# Patient Record
Sex: Male | Born: 1960 | Race: White | Hispanic: No | Smoking: Never smoker
Health system: Southern US, Community
[De-identification: ages and names within clinical notes are randomized; demographics above are authoritative.]

## PROBLEM LIST (undated history)

## (undated) DIAGNOSIS — E079 Disorder of thyroid, unspecified: Secondary | ICD-10-CM

## (undated) DIAGNOSIS — Z87442 Personal history of urinary calculi: Secondary | ICD-10-CM

## (undated) DIAGNOSIS — M1990 Unspecified osteoarthritis, unspecified site: Secondary | ICD-10-CM

## (undated) DIAGNOSIS — N200 Calculus of kidney: Secondary | ICD-10-CM

## (undated) DIAGNOSIS — I10 Essential (primary) hypertension: Secondary | ICD-10-CM

## (undated) HISTORY — DX: Unspecified osteoarthritis, unspecified site: M19.90

---

## 2010-09-29 ENCOUNTER — Emergency Department (HOSPITAL_COMMUNITY)
Admission: EM | Admit: 2010-09-29 | Discharge: 2010-09-29 | Payer: Self-pay | Source: Home / Self Care | Admitting: Emergency Medicine

## 2010-12-21 LAB — CBC
HCT: 46.3 % (ref 39.0–52.0)
Hemoglobin: 16.4 g/dL (ref 13.0–17.0)
MCHC: 35.4 g/dL (ref 30.0–36.0)
MCV: 84.6 fL (ref 78.0–100.0)
Platelets: 184 10*3/uL (ref 150–400)
RDW: 12.6 % (ref 11.5–15.5)

## 2010-12-21 LAB — BASIC METABOLIC PANEL
CO2: 27 mEq/L (ref 19–32)
Chloride: 105 mEq/L (ref 96–112)
Creatinine, Ser: 1.35 mg/dL (ref 0.4–1.5)
GFR calc Af Amer: >60 mL/min (ref >60)
Glucose, Bld: 102 mg/dL — ABNORMAL HIGH (ref 70–99)

## 2010-12-21 LAB — DIFFERENTIAL
Basophils Relative: 0 % (ref 0–1)
Eosinophils Absolute: 0 10*3/uL (ref 0.0–0.7)
Neutro Abs: 5.9 10*3/uL (ref 1.7–7.7)
Neutrophils Relative %: 86 % — ABNORMAL HIGH (ref 43–77)

## 2010-12-21 LAB — URINALYSIS, ROUTINE W REFLEX MICROSCOPIC
Leukocytes, UA: NEGATIVE
Nitrite: NEGATIVE
Specific Gravity, Urine: 1.013 (ref 1.005–1.030)
Urobilinogen, UA: 0.2 mg/dL (ref 0.0–1.0)
pH: 6 (ref 5.0–8.0)

## 2010-12-21 LAB — URINE MICROSCOPIC-ADD ON

## 2012-07-17 ENCOUNTER — Encounter (INDEPENDENT_AMBULATORY_CARE_PROVIDER_SITE_OTHER): Payer: Self-pay | Admitting: Surgery

## 2012-07-20 ENCOUNTER — Other Ambulatory Visit (INDEPENDENT_AMBULATORY_CARE_PROVIDER_SITE_OTHER): Payer: Self-pay | Admitting: Surgery

## 2012-07-20 ENCOUNTER — Encounter (INDEPENDENT_AMBULATORY_CARE_PROVIDER_SITE_OTHER): Payer: Self-pay | Admitting: Surgery

## 2012-07-20 ENCOUNTER — Ambulatory Visit
Admission: RE | Admit: 2012-07-20 | Discharge: 2012-07-20 | Disposition: A | Discharge status: Home / Self Care | Payer: 59 | Source: Ambulatory Visit | Attending: Surgery | Admitting: Surgery

## 2012-07-20 ENCOUNTER — Ambulatory Visit (INDEPENDENT_AMBULATORY_CARE_PROVIDER_SITE_OTHER): Payer: 59 | Admitting: Surgery

## 2012-07-20 VITALS — BP 126/80 | HR 73 | Temp 98.0°F | Resp 18 | Ht 66.0 in | Wt 126.4 lb

## 2012-07-20 DIAGNOSIS — R229 Localized swelling, mass and lump, unspecified: Secondary | ICD-10-CM

## 2012-07-20 DIAGNOSIS — R634 Abnormal weight loss: Secondary | ICD-10-CM

## 2012-07-20 DIAGNOSIS — R509 Fever, unspecified: Secondary | ICD-10-CM

## 2012-07-20 DIAGNOSIS — R2230 Localized swelling, mass and lump, unspecified upper limb: Secondary | ICD-10-CM

## 2012-07-20 NOTE — Progress Notes (Signed)
Patient ID: Alexander Cervantes, male   DOB: 11-13-1960, 51 y.o.   MRN: [id number]  Chief Complaint  Patient presents with  . Other    left arm nodule    HPI Alexander Cervantes is a 51 y.o. male.   HPI This is a pleasant gentleman referred by Dr. Foy Guadalajara and Gillermina Hu PA for a small mass on his left arm just above the elbow. It had been present for almost one year, but is now getting larger. Last week he is having increasing discomfort as well some very slight numbness in the left hand. He reports he has some pain it goes into the left axilla as well. He also reports occasional fevers and chills. His appetite is poor and he is having weight loss. He denies abdominal pain, nausea, or vomiting. He also denies trauma to the extremity. History reviewed. No pertinent past medical history.  History reviewed. No pertinent past surgical history.  History reviewed. No pertinent family history.  Social History History  Substance Use Topics  . Smoking status: Never Smoker   . Smokeless tobacco: Not on file  . Alcohol Use: No    No Known Allergies  Current Outpatient Prescriptions  Medication Sig Dispense Refill  . naproxen sodium (ANAPROX) 220 MG tablet Take 220 mg by mouth 2 (two) times daily with a meal.        Review of Systems Review of Systems  Constitutional: Positive for fever, appetite change and fatigue.  HENT: Negative.   Eyes: Negative.   Respiratory: Positive for cough.   Cardiovascular: Negative.   Gastrointestinal: Negative.   Genitourinary: Negative.   Musculoskeletal: Positive for myalgias and joint swelling.  Neurological: Positive for numbness.  Hematological: Negative.   Psychiatric/Behavioral: Negative.     Blood pressure 126/80, pulse 73, temperature 98 F (36.7 C), temperature source Oral, resp. rate 18, height 5\' 6"  (1.676 m), weight 126 lb 6.4 oz (57.335 kg).  Physical Exam Physical Exam  Constitutional: He is oriented to person, place, and time. He appears  well-developed and well-nourished. No distress.  HENT:  Head: Normocephalic and atraumatic.  Right Ear: External ear normal.  Left Ear: External ear normal.  Nose: Nose normal.  Mouth/Throat: Oropharynx is clear and moist. No oropharyngeal exudate.  Eyes: Conjunctivae normal are normal. Pupils are equal, round, and reactive to light. Right eye exhibits no discharge. Left eye exhibits no discharge. No scleral icterus.  Neck: Normal range of motion. Neck supple. No tracheal deviation present.  Cardiovascular: Normal rate, regular rhythm, normal heart sounds and intact distal pulses.   No murmur heard. Pulmonary/Chest: Effort normal and breath sounds normal. No respiratory distress. He has no wheezes.  Abdominal: Soft. Bowel sounds are normal. He exhibits no distension. There is no tenderness. There is no rebound.  Musculoskeletal: Normal range of motion. He exhibits no edema.       There is a firm mass which is approximately 3-4  Centimeters in size on the medial aspect of his left arm just above the elbow.  It almost feels involving the tendon. He does have full range of motion at the elbow and strength in the left hand and wrist appears normal. The mass is nonpulsatile  Lymphadenopathy:    He has no cervical adenopathy.    He has no axillary adenopathy.       Right: No inguinal adenopathy present.       Left: No inguinal adenopathy present.  Neurological: He is alert and oriented to person, place, and time.  Skin: Skin is warm and dry. He is not diaphoretic. No erythema. No pallor.  Psychiatric: His behavior is normal. Judgment normal.    Data Reviewed   Assessment    Left arm mass of uncertain etiology    Plan    Because the location of this mass, I believe he needs evaluation by orthopedic surgeon. I have discussed this with Dr. Allie Bossier. He will be seeing the patient in consultation. At his request, I have ordered an MRI with contrast of the extremity to evaluate the  masses proximity to the tendons as well as neurovascular supply. I am also going to check a chest x-ray as well as a CBC with differential and BMP given his symptoms. I will arrange the appointment with [facility] orthopedics.       Lakrisha Iseman A 07/20/2012, 3:41 PM

## 2012-07-25 ENCOUNTER — Inpatient Hospital Stay: Admission: RE | Admit: 2012-07-25 | Payer: Self-pay | Source: Ambulatory Visit

## 2012-07-26 ENCOUNTER — Ambulatory Visit
Admission: RE | Admit: 2012-07-26 | Discharge: 2012-07-26 | Disposition: A | Discharge status: Home / Self Care | Payer: 59 | Source: Ambulatory Visit | Attending: Surgery | Admitting: Surgery

## 2012-07-26 DIAGNOSIS — R2230 Localized swelling, mass and lump, unspecified upper limb: Secondary | ICD-10-CM

## 2012-07-29 ENCOUNTER — Other Ambulatory Visit: Payer: 59

## 2012-07-31 ENCOUNTER — Encounter (HOSPITAL_COMMUNITY): Payer: Self-pay | Admitting: *Deleted

## 2012-07-31 ENCOUNTER — Encounter (HOSPITAL_COMMUNITY): Payer: Self-pay | Admitting: Pharmacy Technician

## 2012-07-31 ENCOUNTER — Other Ambulatory Visit (HOSPITAL_COMMUNITY): Payer: Self-pay | Admitting: Orthopaedic Surgery

## 2012-07-31 NOTE — Progress Notes (Signed)
Spoke with Sherrie from Dr. Eliberto Ivory office requesting orders be signed.

## 2012-07-31 NOTE — Progress Notes (Signed)
Need orders in [facility] for surgery 08/01/12

## 2012-08-01 ENCOUNTER — Ambulatory Visit (HOSPITAL_COMMUNITY): Payer: 59 | Admitting: Anesthesiology

## 2012-08-01 ENCOUNTER — Encounter (HOSPITAL_COMMUNITY): Payer: Self-pay | Admitting: *Deleted

## 2012-08-01 ENCOUNTER — Ambulatory Visit (HOSPITAL_COMMUNITY)
Admission: RE | Admit: 2012-08-01 | Discharge: 2012-08-01 | Disposition: A | Discharge status: Home / Self Care | Payer: 59 | Source: Ambulatory Visit | Attending: Orthopaedic Surgery | Admitting: Orthopaedic Surgery

## 2012-08-01 ENCOUNTER — Encounter (HOSPITAL_COMMUNITY)
Admission: RE | Disposition: A | Discharge status: Home / Self Care | Payer: Self-pay | Source: Ambulatory Visit | Attending: Orthopaedic Surgery

## 2012-08-01 ENCOUNTER — Encounter (HOSPITAL_COMMUNITY): Payer: Self-pay | Admitting: Anesthesiology

## 2012-08-01 DIAGNOSIS — R229 Localized swelling, mass and lump, unspecified: Secondary | ICD-10-CM | POA: Insufficient documentation

## 2012-08-01 DIAGNOSIS — E65 Localized adiposity: Secondary | ICD-10-CM | POA: Insufficient documentation

## 2012-08-01 DIAGNOSIS — I889 Nonspecific lymphadenitis, unspecified: Secondary | ICD-10-CM | POA: Insufficient documentation

## 2012-08-01 DIAGNOSIS — L089 Local infection of the skin and subcutaneous tissue, unspecified: Secondary | ICD-10-CM | POA: Insufficient documentation

## 2012-08-01 DIAGNOSIS — Z7982 Long term (current) use of aspirin: Secondary | ICD-10-CM | POA: Insufficient documentation

## 2012-08-01 DIAGNOSIS — L905 Scar conditions and fibrosis of skin: Secondary | ICD-10-CM | POA: Insufficient documentation

## 2012-08-01 DIAGNOSIS — M7989 Other specified soft tissue disorders: Secondary | ICD-10-CM | POA: Insufficient documentation

## 2012-08-01 DIAGNOSIS — M79609 Pain in unspecified limb: Secondary | ICD-10-CM | POA: Insufficient documentation

## 2012-08-01 DIAGNOSIS — R2230 Localized swelling, mass and lump, unspecified upper limb: Secondary | ICD-10-CM

## 2012-08-01 HISTORY — PX: MASS EXCISION: SHX2000

## 2012-08-01 LAB — CBC
MCHC: 34 g/dL (ref 30.0–36.0)
RBC: 5.48 MIL/uL (ref 4.22–5.81)

## 2012-08-01 SURGERY — EXCISION MASS
Anesthesia: General | Site: Arm Lower | Laterality: Left | Wound class: Clean

## 2012-08-01 MED ORDER — OXYCODONE-ACETAMINOPHEN 5-325 MG PO TABS: 1.0000 | ORAL_TABLET | ORAL | Status: AC | PRN

## 2012-08-01 MED ORDER — LACTATED RINGERS IV SOLN
INTRAVENOUS | Status: DC
  Administered 2012-08-01: 19:00:00 via INTRAVENOUS
  Administered 2012-08-01: 1000 mL via INTRAVENOUS

## 2012-08-01 MED ORDER — FENTANYL CITRATE 0.05 MG/ML IJ SOLN
INTRAMUSCULAR | Status: DC | PRN
  Administered 2012-08-01 (×2): 50 ug via INTRAVENOUS

## 2012-08-01 MED ORDER — OXYCODONE-ACETAMINOPHEN 5-325 MG PO TABS
ORAL_TABLET | ORAL | Status: AC
  Administered 2012-08-01: 1 via ORAL
  Filled 2012-08-01: qty 1

## 2012-08-01 MED ORDER — BUPIVACAINE HCL (PF) 0.25 % IJ SOLN
INTRAMUSCULAR | Status: AC
  Filled 2012-08-01: qty 30

## 2012-08-01 MED ORDER — MUPIROCIN 2 % EX OINT
TOPICAL_OINTMENT | Freq: Two times a day (BID) | CUTANEOUS | Status: DC
  Administered 2012-08-01: 1 via NASAL
  Filled 2012-08-01: qty 22

## 2012-08-01 MED ORDER — LIDOCAINE HCL (CARDIAC) 20 MG/ML IV SOLN
INTRAVENOUS | Status: DC | PRN
  Administered 2012-08-01: 100 mg via INTRAVENOUS

## 2012-08-01 MED ORDER — PROMETHAZINE HCL 25 MG/ML IJ SOLN: 6.2500 mg | INTRAMUSCULAR | Status: DC | PRN

## 2012-08-01 MED ORDER — 0.9 % SODIUM CHLORIDE (POUR BTL) OPTIME
TOPICAL | Status: DC | PRN
  Administered 2012-08-01: 1000 mL

## 2012-08-01 MED ORDER — ONDANSETRON HCL 4 MG/2ML IJ SOLN
INTRAMUSCULAR | Status: DC | PRN
  Administered 2012-08-01: 4 mg via INTRAVENOUS

## 2012-08-01 MED ORDER — BUPIVACAINE HCL 0.25 % IJ SOLN
INTRAMUSCULAR | Status: DC | PRN
  Administered 2012-08-01: 10 mL

## 2012-08-01 MED ORDER — MEPERIDINE HCL 50 MG/ML IJ SOLN
6.2500 mg | INTRAMUSCULAR | Status: DC | PRN
  Administered 2012-08-01: 12.5 mg via INTRAVENOUS

## 2012-08-01 MED ORDER — MIDAZOLAM HCL 5 MG/5ML IJ SOLN
INTRAMUSCULAR | Status: DC | PRN
  Administered 2012-08-01: 2 mg via INTRAVENOUS

## 2012-08-01 MED ORDER — MEPERIDINE HCL 50 MG/ML IJ SOLN
INTRAMUSCULAR | Status: AC
  Filled 2012-08-01: qty 1

## 2012-08-01 MED ORDER — PROPOFOL 10 MG/ML IV BOLUS
INTRAVENOUS | Status: DC | PRN
  Administered 2012-08-01: 180 mg via INTRAVENOUS

## 2012-08-01 MED ORDER — HYDROMORPHONE HCL PF 1 MG/ML IJ SOLN
INTRAMUSCULAR | Status: DC | PRN
  Administered 2012-08-01 (×3): .4 mg via INTRAVENOUS
  Administered 2012-08-01: .2 mg via INTRAVENOUS

## 2012-08-01 MED ORDER — CEFAZOLIN SODIUM-DEXTROSE 2-3 GM-% IV SOLR
INTRAVENOUS | Status: AC
  Filled 2012-08-01: qty 50

## 2012-08-01 MED ORDER — CEFAZOLIN SODIUM-DEXTROSE 2-3 GM-% IV SOLR
2.0000 g | INTRAVENOUS | Status: AC
  Administered 2012-08-01: 2 g via INTRAVENOUS

## 2012-08-01 MED ORDER — HYDROMORPHONE HCL PF 1 MG/ML IJ SOLN: 0.2500 mg | INTRAMUSCULAR | Status: DC | PRN

## 2012-08-01 SURGICAL SUPPLY — 37 items
BANDAGE ELASTIC 4 VELCRO ST LF (GAUZE/BANDAGES/DRESSINGS) ×2 IMPLANT
BANDAGE ELASTIC 6 VELCRO ST LF (GAUZE/BANDAGES/DRESSINGS) IMPLANT
BANDAGE ESMARK 6X9 LF (GAUZE/BANDAGES/DRESSINGS) ×1 IMPLANT
BNDG ESMARK 6X9 LF (GAUZE/BANDAGES/DRESSINGS) ×2
CLOTH BEACON ORANGE TIMEOUT ST (SAFETY) ×2 IMPLANT
CORDS BIPOLAR (ELECTRODE) ×2 IMPLANT
COVER SURGICAL LIGHT HANDLE (MISCELLANEOUS) ×2 IMPLANT
CUFF TOURN SGL QUICK 18 (TOURNIQUET CUFF) ×2 IMPLANT
CUFF TOURN SGL QUICK 34 (TOURNIQUET CUFF) ×1
CUFF TRNQT CYL 34X4X40X1 (TOURNIQUET CUFF) ×1 IMPLANT
DRAPE POUCH INSTRU U-SHP 10X18 (DRAPES) IMPLANT
DRAPE U-SHAPE 47X51 STRL (DRAPES) ×2 IMPLANT
DRSG ADAPTIC 3X8 NADH LF (GAUZE/BANDAGES/DRESSINGS) IMPLANT
DURAPREP 26ML APPLICATOR (WOUND CARE) ×2 IMPLANT
GAUZE XEROFORM 4X4 STRL (GAUZE/BANDAGES/DRESSINGS) ×2 IMPLANT
GLOVE ORTHO TXT STRL SZ7.5 (GLOVE) ×2 IMPLANT
GOWN PREVENTION PLUS LG XLONG (DISPOSABLE) IMPLANT
GOWN STRL REIN XL XLG (GOWN DISPOSABLE) ×4 IMPLANT
KIT BASIN OR (CUSTOM PROCEDURE TRAY) ×2 IMPLANT
MANIFOLD NEPTUNE II (INSTRUMENTS) ×2 IMPLANT
NS IRRIG 1000ML POUR BTL (IV SOLUTION) ×2 IMPLANT
PACK LOWER EXTREMITY WL (CUSTOM PROCEDURE TRAY) ×2 IMPLANT
PADDING CAST ABS 4INX4YD NS (CAST SUPPLIES) ×1
PADDING CAST ABS COTTON 4X4 ST (CAST SUPPLIES) ×1 IMPLANT
POSITIONER SURGICAL ARM (MISCELLANEOUS) ×2 IMPLANT
SPONGE GAUZE 4X4 12PLY (GAUZE/BANDAGES/DRESSINGS) ×2 IMPLANT
SPONGE LAP 18X18 X RAY DECT (DISPOSABLE) ×2 IMPLANT
SUCTION FRAZIER 12FR DISP (SUCTIONS) IMPLANT
SUT ETHILON 3 0 PS 1 (SUTURE) ×4 IMPLANT
SUT ETHILON 4 0 PS 2 18 (SUTURE) ×4 IMPLANT
SUT PROLENE 4 0 RB 1 (SUTURE)
SUT PROLENE 4-0 RB1 .5 CRCL 36 (SUTURE) IMPLANT
SUT VIC AB 2-0 SH 27 (SUTURE) ×2
SUT VIC AB 2-0 SH 27X BRD (SUTURE) ×2 IMPLANT
TOWEL BLUE STERILE X RAY DET (MISCELLANEOUS) ×6 IMPLANT
TOWEL OR 17X26 10 PK STRL BLUE (TOWEL DISPOSABLE) ×2 IMPLANT
WATER STERILE IRR 1500ML POUR (IV SOLUTION) IMPLANT

## 2012-08-01 NOTE — Transfer of Care (Signed)
Immediate Anesthesia Transfer of Care Note  Patient: Alexander Cervantes  Procedure(s) Performed: Procedure(s) (LRB) with comments: EXCISION MASS (Left) - Excision left elbow mass  Patient Location: PACU  Anesthesia Type: General  Level of Consciousness: awake and alert   Airway & Oxygen Therapy: Patient Spontanous Breathing and Patient connected to face mask oxygen  Post-op Assessment: Report given to PACU RN and Post -op Vital signs reviewed and stable  Post vital signs: Reviewed and stable  Complications: No apparent anesthesia complications

## 2012-08-01 NOTE — Anesthesia Preprocedure Evaluation (Signed)

## 2012-08-01 NOTE — Preoperative (Signed)
Beta Blockers   Reason not to administer Beta Blockers:Not Applicable 

## 2012-08-01 NOTE — Anesthesia Postprocedure Evaluation (Signed)
  Anesthesia Post-op Note  Patient: Alexander Cervantes  Procedure(s) Performed: Procedure(s) (LRB): EXCISION MASS (Left)  Patient Location: PACU  Anesthesia Type: General  Level of Consciousness: awake and alert   Airway and Oxygen Therapy: Patient Spontanous Breathing  Post-op Pain: mild  Post-op Assessment: Post-op Vital signs reviewed, Patient's Cardiovascular Status Stable, Respiratory Function Stable, Patent Airway and No signs of Nausea or vomiting  Post-op Vital Signs: stable  Complications: No apparent anesthesia complications

## 2012-08-01 NOTE — Progress Notes (Signed)
pacu note. Pt vomited aprox. 150cc's clear with partially digested crackers, Dr. Council Mechanic notified. Order to  Keep pt. Until he can tolerate fluid for at least 30 min.received.

## 2012-08-01 NOTE — Brief Op Note (Signed)
08/01/2012  7:31 PM  PATIENT:  Alexander Cervantes  51 y.o. male  PRE-OPERATIVE DIAGNOSIS:  Left elbow mass  POST-OPERATIVE DIAGNOSIS:  Left elbow mass  PROCEDURE:  Procedure(s) (LRB) with comments: EXCISION MASS (Left) - Excision left elbow mass  SURGEON:  Surgeon(s) and Role:    * Kathryne Hitch, MD - Primary  PHYSICIAN ASSISTANT:   ASSISTANTS: none   ANESTHESIA:   local and general  EBL:     BLOOD ADMINISTERED:none  DRAINS: none and Urinary Catheter (Suprapubic)   LOCAL MEDICATIONS USED:  MARCAINE     SPECIMEN:  Biopsy / Limited Resection  DISPOSITION OF SPECIMEN:  PATHOLOGY  COUNTS:  YES  TOURNIQUET:   Total Tourniquet Time Documented: Upper Arm (Left) - 25 minutes  DICTATION: .Other Dictation: Dictation Number [id number]  PLAN OF CARE: Discharge to home after PACU  PATIENT DISPOSITION:  PACU - hemodynamically stable.   Delay start of Pharmacological VTE agent (>24hrs) due to surgical blood loss or risk of bleeding: not applicable

## 2012-08-01 NOTE — H&P (Signed)
Alexander Cervantes is an 51 y.o. male.   Chief Complaint:   Left arm enlarging, painful mass HPI:   51 yo male with a several week history of an enlarging and painful mass at his medial left arm.  A MRI was obtained that found significant inflammation of the surrounding tissue and likely an enlarged lymph node.  Due to the increasing size and pain, it is recommended that he have this excised and sent for pathologic evaluation.  The risks include infection and nerve/vessel injury.  History reviewed. No pertinent past medical history.  History reviewed. No pertinent past surgical history.  History reviewed. No pertinent family history. Social History:  reports that he has never smoked. He has never used smokeless tobacco. He reports that he does not drink alcohol or use illicit drugs.  Allergies: No Known Allergies  Medications Prior to Admission  Medication Sig Dispense Refill  . aspirin 325 MG tablet Take 325 mg by mouth daily as needed. For pain      . ibuprofen (ADVIL,MOTRIN) 200 MG tablet Take 200 mg by mouth every 6 (six) hours as needed.      Marland Kitchen dextromethorphan-guaiFENesin (MUCINEX DM) 30-600 MG per 12 hr tablet Take 1 tablet by mouth every 12 (twelve) hours. For chest congestion        Results for orders placed during the hospital encounter of 08/01/12 (from the past 48 hour(s))  CBC     Status: Normal   Collection Time   08/01/12  3:49 PM      Component Value Range Comment   WBC 4.4  4.0 - 10.5 K/uL    RBC 5.48  4.22 - 5.81 MIL/uL    Hemoglobin 15.2  13.0 - 17.0 g/dL    HCT 16.1  09.6 - 04.5 %    MCV 81.6  78.0 - 100.0 fL    MCH 27.7  26.0 - 34.0 pg    MCHC 34.0  30.0 - 36.0 g/dL    RDW 40.9  81.1 - 91.4 %    Platelets 290  150 - 400 K/uL    No results found.  Review of Systems  All other systems reviewed and are negative.    Blood pressure 155/98, pulse 76, temperature 98.1 F (36.7 C), temperature source Oral, resp. rate 18, height 5\' 6"  (1.676 m), weight 58.231 kg  (128 lb 6 oz), SpO2 100.00%. Physical Exam  Constitutional: He is oriented to person, place, and time. He appears well-developed and well-nourished.  HENT:  Head: Normocephalic and atraumatic.  Eyes: EOM are normal. Pupils are equal, round, and reactive to light.  Neck: Normal range of motion. Neck supple.  Cardiovascular: Normal rate and regular rhythm.   Respiratory: Effort normal and breath sounds normal.  GI: Soft. Bowel sounds are normal.  Musculoskeletal:       Left elbow: He exhibits swelling.       Arms: Neurological: He is alert and oriented to person, place, and time.  Skin: Skin is warm and dry.  Psychiatric: He has a normal mood and affect.     Assessment/Plan Left arm mass with MRI consistent with large lymph node and inflamed tissue 1) to the OR for exploration of soft tissues of medial left upper arm and excision with biopsy mass  BLACKMAN,CHRISTOPHER Y 08/01/2012, 5:32 PM

## 2012-08-02 ENCOUNTER — Encounter (HOSPITAL_COMMUNITY): Payer: Self-pay | Admitting: Orthopaedic Surgery

## 2012-08-02 NOTE — Op Note (Signed)
Alexander Cervantes, JEFFERYS NO.:  [id number]  MEDICAL RECORD NO.:  [id number]  LOCATION:  [facility]                         FACILITY:  [facility]  PHYSICIAN:  Vanita Panda. Magnus Ivan, M.D.DATE OF BIRTH:  07/23/1961  DATE OF PROCEDURE:  08/01/2012 DATE OF DISCHARGE:  08/01/2012                              OPERATIVE REPORT   PREPROCEDURE DIAGNOSIS:  Left arm painful enlarging mass.  POSTOPERATIVE DIAGNOSIS:  Left arm painful enlarging mass.  PROCEDURE:  Exploration of left medial arm with removal of tissue consistent with chronic inflammatory tissue and questionable lymph tissue.  FINDINGS:  Chronic inflammatory tissue, left arm with questionable inflamed lymph nodes with permanent pathology sent and pending.  SURGEON:  Vanita Panda. Magnus Ivan, MD  ANESTHESIA: 1. General. 2. Local with 0.25% plain Sensorcaine.  TOURNIQUET TIME:  Less than 1 hour.  BLOOD LOSS:  700 mL.  COMPLICATIONS:  None.  INDICATIONS:  Alexander Cervantes is a 51 year old gentleman, who had developed painful enlarging mass on the medial aspect of his left arm just proximal to the medial epicondyle.  He denied any specific illnesses and with this enlarging painful area, an MRI was obtained.  The MRI showed inflammatory tissue and it was felt to be enlarged lymph nodes.  There was no malignant quality or worrisome quality of this tissue. Due to the significant pain and enlarging area of this, we recommended he undergo exploration and excision.  The risks and benefits of this were explained to him in detail and he did wish to proceed with surgery.  PROCEDURE DESCRIPTION:  After informed consent was obtained, appropriate left arm was marked, he was brought to the operating room, placed supine on the operating table, left arm on arm table.  General anesthesia was then obtained.  His left arm was prepped and draped from the axilla down to the wrist with DuraPrep and sterile drapes including sterile stockinette.   A time-out was called to identify correct patient, correct left arm.  I then placed a sterile tourniquet around approximately and was able to use an Esmarch to wrap up the arm and tourniquet was inflated to 250 mm of pressure.  I then made an incision just proximal to the medial epicondyle where the swollen tissue was located and carried this up the arm.  I dissected meticulously the soft tissue and identified the artery and cutaneous nerves.  There was chronic inflamed tissue that was necrotic appearing in nature all in the medial aspect of his arm, which I removed pieces of sent for pathology.  There was significant scar tissue in this area too which made it certainly difficult to excise any more given the proximity of the neurovascular bundle.  Once I seem to decompress this area and found nothing worrisome, we copiously irrigated this with the tourniquet down. Hemostasis was obtained with electrocautery.  We irrigated the tissues with normal saline solution and then closed the subcutaneous tissue with interrupted 2-0 Vicryl suture followed by interrupted 3-0 nylon. Xeroform followed by well-padded sterile dressing was applied and the patient was awakened, extubated, and taken to recovery room in stable condition.  All final counts were correct.  There were no complications noted.  Postoperatively, we will be discharged home  from the recovery room with followup in the office in 2 weeks.     Vanita Panda. Magnus Ivan, M.D.     CYB/MEDQ  D:  08/01/2012  T:  08/02/2012  Job:  [id number]

## 2014-09-19 ENCOUNTER — Emergency Department (HOSPITAL_COMMUNITY)
Admission: EM | Admit: 2014-09-19 | Discharge: 2014-09-19 | Disposition: A | Discharge status: Home / Self Care | Payer: Managed Care, Other (non HMO) | Attending: Emergency Medicine | Admitting: Emergency Medicine

## 2014-09-19 ENCOUNTER — Encounter (HOSPITAL_COMMUNITY): Payer: Self-pay | Admitting: Emergency Medicine

## 2014-09-19 ENCOUNTER — Emergency Department (HOSPITAL_COMMUNITY): Payer: Managed Care, Other (non HMO)

## 2014-09-19 DIAGNOSIS — R55 Syncope and collapse: Secondary | ICD-10-CM

## 2014-09-19 DIAGNOSIS — I10 Essential (primary) hypertension: Secondary | ICD-10-CM | POA: Insufficient documentation

## 2014-09-19 DIAGNOSIS — B349 Viral infection, unspecified: Secondary | ICD-10-CM | POA: Diagnosis not present

## 2014-09-19 DIAGNOSIS — R0602 Shortness of breath: Secondary | ICD-10-CM

## 2014-09-19 DIAGNOSIS — Z79899 Other long term (current) drug therapy: Secondary | ICD-10-CM | POA: Insufficient documentation

## 2014-09-19 DIAGNOSIS — E079 Disorder of thyroid, unspecified: Secondary | ICD-10-CM | POA: Diagnosis not present

## 2014-09-19 DIAGNOSIS — R079 Chest pain, unspecified: Secondary | ICD-10-CM | POA: Diagnosis present

## 2014-09-19 HISTORY — DX: Disorder of thyroid, unspecified: E07.9

## 2014-09-19 HISTORY — DX: Essential (primary) hypertension: I10

## 2014-09-19 LAB — BASIC METABOLIC PANEL
Anion gap: 12 (ref 5–15)
BUN: 13 mg/dL (ref 6–23)
CALCIUM: 8.6 mg/dL (ref 8.4–10.5)
CO2: 23 meq/L (ref 19–32)
Chloride: 107 mEq/L (ref 96–112)
Creatinine, Ser: 0.88 mg/dL (ref 0.50–1.35)
GFR calc Af Amer: >90 mL/min (ref >90)
GLUCOSE: 107 mg/dL — AB (ref 70–99)
POTASSIUM: 3.9 meq/L (ref 3.7–5.3)
SODIUM: 142 meq/L (ref 137–147)

## 2014-09-19 LAB — CBC WITH DIFFERENTIAL/PLATELET
Basophils Absolute: 0 10*3/uL (ref 0.0–0.1)
Basophils Relative: 1 % (ref 0–1)
Eosinophils Absolute: 0 10*3/uL (ref 0.0–0.7)
Eosinophils Relative: 0 % (ref 0–5)
HCT: 43.9 % (ref 39.0–52.0)
HEMOGLOBIN: 15.3 g/dL (ref 13.0–17.0)
LYMPHS ABS: 0.6 10*3/uL — AB (ref 0.7–4.0)
LYMPHS PCT: 14 % (ref 12–46)
MCH: 28.6 pg (ref 26.0–34.0)
MCHC: 34.9 g/dL (ref 30.0–36.0)
MCV: 82.1 fL (ref 78.0–100.0)
MONOS PCT: 9 % (ref 3–12)
Monocytes Absolute: 0.3 10*3/uL (ref 0.1–1.0)
NEUTROS ABS: 2.9 10*3/uL (ref 1.7–7.7)
NEUTROS PCT: 76 % (ref 43–77)
PLATELETS: 159 10*3/uL (ref 150–400)
RBC: 5.35 MIL/uL (ref 4.22–5.81)
RDW: 13.3 % (ref 11.5–15.5)
WBC: 3.8 10*3/uL — AB (ref 4.0–10.5)

## 2014-09-19 LAB — I-STAT TROPONIN, ED: TROPONIN I, POC: 0 ng/mL (ref 0.00–0.08)

## 2014-09-19 LAB — PRO B NATRIURETIC PEPTIDE: PRO B NATRI PEPTIDE: 19.5 pg/mL (ref 0–125)

## 2014-09-19 MED ORDER — DIPHENHYDRAMINE HCL 50 MG/ML IJ SOLN
12.5000 mg | Freq: Once | INTRAMUSCULAR | Status: AC
  Administered 2014-09-19: 12.5 mg via INTRAVENOUS
  Filled 2014-09-19: qty 1

## 2014-09-19 MED ORDER — KETOROLAC TROMETHAMINE 30 MG/ML IJ SOLN
30.0000 mg | Freq: Once | INTRAMUSCULAR | Status: AC
  Administered 2014-09-19: 30 mg via INTRAVENOUS
  Filled 2014-09-19: qty 1

## 2014-09-19 MED ORDER — METOCLOPRAMIDE HCL 5 MG/ML IJ SOLN
5.0000 mg | Freq: Once | INTRAMUSCULAR | Status: AC
  Administered 2014-09-19: 5 mg via INTRAVENOUS
  Filled 2014-09-19: qty 2

## 2014-09-19 MED ORDER — SODIUM CHLORIDE 0.9 % IV BOLUS (SEPSIS)
1000.0000 mL | INTRAVENOUS | Status: AC
  Administered 2014-09-19: 1000 mL via INTRAVENOUS

## 2014-09-19 NOTE — ED Notes (Signed)
The patient has been having probelms since Tuesday so he went to see his PCP today.  The PCP gave him 1L of fluid due to a syncopal episode he had at the MD office.  The patient said that he has felt like this since Tuesday and he finally went to the MD today because he had an opportunity to go today.  He has had SOB, chest pain, dizziness, nauseous and vomiting.  He also says his pain radiates to his back.

## 2014-09-19 NOTE — ED Provider Notes (Signed)
CSN: [id number]     Arrival date & time 09/19/14  1554 History   First MD Initiated Contact with Patient 09/19/14 1806     Chief Complaint  Patient presents with  . Shortness of Breath    The patient has been having probelms since Tuesday so he went to see his PCP today.  The PCP gave him 1L of fluid due to a syncopal episode he had at the MD office.  . Chest Pain     (Consider location/radiation/quality/duration/timing/severity/associated sxs/prior Treatment) Patient is a 53 y.o. male presenting with chest pain. The history is provided by the patient.  Chest Pain Pain location:  Substernal area Pain quality: pressure   Pain radiates to:  Mid back and upper back Pain radiates to the back: yes   Pain severity:  Mild Onset quality:  Gradual Timing: had it 2 days ago, but not since then. Progression:  Resolved Chronicity:  New Context comment:  While at work Relieved by:  Nothing Worsened by:  Nothing tried Ineffective treatments:  None tried Associated symptoms: dizziness   Associated symptoms: no nausea and no numbness     Past Medical History  Diagnosis Date  . Hypertension   . Thyroid disease    Past Surgical History  Procedure Laterality Date  . Mass excision  08/01/2012    Procedure: EXCISION MASS;  Surgeon: Mcarthur Rossetti, MD;  Location: [facility];  Service: Orthopedics;  Laterality: Left;  Excision left elbow mass   History reviewed. No pertinent family history. History  Substance Use Topics  . Smoking status: Never Smoker   . Smokeless tobacco: Never Used  . Alcohol Use: No    Review of Systems  Constitutional: Positive for chills.  HENT: Negative for drooling and rhinorrhea.   Eyes: Negative for pain.  Cardiovascular: Positive for chest pain. Negative for leg swelling.  Gastrointestinal: Negative for nausea and diarrhea.  Genitourinary: Negative for dysuria and hematuria.  Musculoskeletal: Negative for gait problem.  Skin: Negative for color  change.  Neurological: Positive for dizziness. Negative for numbness.       Syncope  Hematological: Negative for adenopathy.  Psychiatric/Behavioral: Negative for behavioral problems.  All other systems reviewed and are negative.     Allergies  Review of patient's allergies indicates no known allergies.  Home Medications   Prior to Admission medications   Medication Sig Start Date End Date Taking? Authorizing Provider  amLODipine (NORVASC) 5 MG tablet Take 5 mg by mouth daily.   Yes Historical Provider, MD  dextromethorphan-guaiFENesin (MUCINEX DM) 30-600 MG per 12 hr tablet Take 1 tablet by mouth every 12 (twelve) hours. For chest congestion   Yes Historical Provider, MD  levothyroxine (SYNTHROID, LEVOTHROID) 75 MCG tablet Take 75 mcg by mouth daily before breakfast.   Yes Historical Provider, MD  lisinopril (PRINIVIL,ZESTRIL) 10 MG tablet Take 10 mg by mouth daily.   Yes Historical Provider, MD   BP 133/77 mmHg  Pulse 73  Temp(Src) 97.6 F (36.4 C) (Oral)  Resp 16  Ht 5\' 6"  (1.676 m)  Wt 132 lb (59.875 kg)  BMI 21.32 kg/m2  SpO2 99% Physical Exam  Constitutional: He is oriented to person, place, and time. He appears well-developed and well-nourished.  HENT:  Head: Normocephalic and atraumatic.  Right Ear: External ear normal.  Left Ear: External ear normal.  Nose: Nose normal.  Mouth/Throat: Oropharynx is clear and moist. No oropharyngeal exudate.  Eyes: Conjunctivae and EOM are normal. Pupils are equal, round, and reactive to light.  Neck: Normal range of motion. Neck supple.  Cardiovascular: Normal rate, regular rhythm, normal heart sounds and intact distal pulses.  Exam reveals no gallop and no friction rub.   No murmur heard. Pulmonary/Chest: Effort normal and breath sounds normal. No respiratory distress. He has no wheezes.  Abdominal: Soft. Bowel sounds are normal. He exhibits no distension. There is no tenderness. There is no rebound and no guarding.   Musculoskeletal: Normal range of motion. He exhibits no edema or tenderness.  Neurological: He is alert and oriented to person, place, and time.  alert, oriented x3 speech: normal in context and clarity memory: intact grossly cranial nerves II-XII: intact motor strength: full proximally and distally no involuntary movements or tremors sensation: intact to light touch diffusely  cerebellar: finger-to-nose and heel-to-shin intact gait: normal forwards and backwards  Skin: Skin is warm and dry.  Psychiatric: He has a normal mood and affect. His behavior is normal.  Nursing note and vitals reviewed.   ED Course  Procedures (including critical care time) Labs Review Labs Reviewed  BASIC METABOLIC PANEL - Abnormal; Notable for the following:    Glucose, Bld 107 (*)    All other components within normal limits  CBC WITH DIFFERENTIAL - Abnormal; Notable for the following:    WBC 3.8 (*)    Lymphs Abs 0.6 (*)    All other components within normal limits  PRO B NATRIURETIC PEPTIDE  I-STAT TROPOININ, ED    Imaging Review Dg Chest 2 View  09/19/2014   CLINICAL DATA:  53 year old male with chest pain radiating to the neck and shoulders. Shortness of breath and productive cough. Initial encounter.  EXAM: CHEST  2 VIEW  COMPARISON:  09/19/2014 [facility] physicians.  07/20/2012.  FINDINGS: Stable and normal lung volumes. Normal cardiac size and mediastinal contours. Visualized tracheal air column is within normal limits. No pneumothorax or pleural effusion, of the lung parenchyma appears stable and clear. Stable visualized osseous structures since 2013 including mild wedging of mid thoracic vertebrae.  IMPRESSION: Stable and largely negative.  No acute cardiopulmonary abnormality.   Electronically Signed   By: Lars Pinks M.D.   On: 09/19/2014 17:57     EKG Interpretation   Date/Time:  Thursday September 19 2014 16:01:07 EST Ventricular Rate:  74 PR Interval:  144 QRS Duration: 86 QT Interval:   370 QTC Calculation: 410 R Axis:   86 Text Interpretation:  Normal sinus rhythm Normal ECG Confirmed by Derrich Gaby   MD, Mikiala Fugett ([id number]) on 09/19/2014 6:35:49 PM      MDM   Final diagnoses:  Viral syndrome  Syncope, unspecified syncope type    6:52 PM 53 y.o. male w hx of HTN who presents with multiple complaints. He states that 2 days ago while at work he developed chills, body aches, shortness of breath, pressure in his chest, and cough. He states that he also developed a gradual bitemporal headache at that time. He notes a waxing waning headache since then and currently rates at a 3 out of 10. He notes subjective fevers. He has not had continued chest pain or shortness of breath since that time. Of note while drawing blood today at his doctor's office he had a syncopal episode during the blood draw. He is afebrile and vital signs are unremarkable here. Lab work is thus far noncontributory. He has a normal neurologic exam. I suspect this is a viral syndrome rather than a cardiac or pulmonary cause of his symptoms.  8:46 PM: HA gone. I interpreted/reviewed  the labs and/or imaging which were non-contributory. Likely viral syndrome.  I have discussed the diagnosis/risks/treatment options with the patient and believe the pt to be eligible for discharge home to follow-up with his pcp. We also discussed returning to the ED immediately if new or worsening sx occur. We discussed the sx which are most concerning (e.g., return of cp, sob, worsening HA) that necessitate immediate return. Medications administered to the patient during their visit and any new prescriptions provided to the patient are listed below.  Medications given during this visit Medications  sodium chloride 0.9 % bolus 1,000 mL (1,000 mLs Intravenous New Bag/Given 09/19/14 1913)  metoCLOPramide (REGLAN) injection 5 mg (5 mg Intravenous Given 09/19/14 1913)  diphenhydrAMINE (BENADRYL) injection 12.5 mg (12.5 mg Intravenous Given  09/19/14 1915)  ketorolac (TORADOL) 30 MG/ML injection 30 mg (30 mg Intravenous Given 09/19/14 1914)    Discharge Medication List as of 09/19/2014  8:45 PM       Pamella Pert, MD 09/20/14 0004

## 2016-09-08 ENCOUNTER — Encounter: Payer: Self-pay | Admitting: Physician Assistant

## 2016-10-19 ENCOUNTER — Encounter: Payer: Self-pay | Admitting: Gastroenterology

## 2016-12-06 ENCOUNTER — Ambulatory Visit (AMBULATORY_SURGERY_CENTER): Payer: Self-pay

## 2016-12-06 VITALS — Ht 66.0 in | Wt 138.8 lb

## 2016-12-06 DIAGNOSIS — Z1211 Encounter for screening for malignant neoplasm of colon: Secondary | ICD-10-CM

## 2016-12-06 MED ORDER — NA SULFATE-K SULFATE-MG SULF 17.5-3.13-1.6 GM/177ML PO SOLN: 1.0000 | Freq: Once | ORAL | 0 refills | Status: AC

## 2016-12-06 NOTE — Progress Notes (Signed)
Denies allergies to eggs and soy. Denies home 02.  Denies problems with anesthesia. Does not use diet pills.  Declines Emmi.

## 2016-12-13 ENCOUNTER — Encounter: Payer: Self-pay | Admitting: Gastroenterology

## 2016-12-20 ENCOUNTER — Encounter: Payer: Self-pay | Admitting: Gastroenterology

## 2016-12-20 ENCOUNTER — Ambulatory Visit (AMBULATORY_SURGERY_CENTER): Payer: PRIVATE HEALTH INSURANCE | Admitting: Gastroenterology

## 2016-12-20 VITALS — BP 130/67 | HR 61 | Temp 97.5°F | Resp 17 | Ht 66.0 in | Wt 138.0 lb

## 2016-12-20 DIAGNOSIS — Z1212 Encounter for screening for malignant neoplasm of rectum: Secondary | ICD-10-CM

## 2016-12-20 DIAGNOSIS — D124 Benign neoplasm of descending colon: Secondary | ICD-10-CM | POA: Diagnosis not present

## 2016-12-20 DIAGNOSIS — Z1211 Encounter for screening for malignant neoplasm of colon: Secondary | ICD-10-CM

## 2016-12-20 DIAGNOSIS — D122 Benign neoplasm of ascending colon: Secondary | ICD-10-CM

## 2016-12-20 MED ORDER — SODIUM CHLORIDE 0.9 % IV SOLN: 500.0000 mL | INTRAVENOUS | Status: DC

## 2016-12-20 NOTE — Progress Notes (Signed)
Called to room to assist during endoscopic procedure.  Patient ID and intended procedure confirmed with present staff. Received instructions for my participation in the procedure from the performing physician.  

## 2016-12-20 NOTE — Progress Notes (Signed)
Report to PACU, RN, vss, BBS= Clear.  

## 2016-12-20 NOTE — Patient Instructions (Signed)
Discharge instructions given. Handout on polyps. Resume previous medications. YOU HAD AN ENDOSCOPIC PROCEDURE TODAY AT THE  ENDOSCOPY CENTER:   Refer to the procedure report that was given to you for any specific questions about what was found during the examination.  If the procedure report does not answer your questions, please call your gastroenterologist to clarify.  If you requested that your care partner not be given the details of your procedure findings, then the procedure report has been included in a sealed envelope for you to review at your convenience later.  YOU SHOULD EXPECT: Some feelings of bloating in the abdomen. Passage of more gas than usual.  Walking can help get rid of the air that was put into your GI tract during the procedure and reduce the bloating. If you had a lower endoscopy (such as a colonoscopy or flexible sigmoidoscopy) you may notice spotting of blood in your stool or on the toilet paper. If you underwent a bowel prep for your procedure, you may not have a normal bowel movement for a few days.  Please Note:  You might notice some irritation and congestion in your nose or some drainage.  This is from the oxygen used during your procedure.  There is no need for concern and it should clear up in a day or so.  SYMPTOMS TO REPORT IMMEDIATELY:   Following lower endoscopy (colonoscopy or flexible sigmoidoscopy):  Excessive amounts of blood in the stool  Significant tenderness or worsening of abdominal pains  Swelling of the abdomen that is new, acute  Fever of 100F or higher   For urgent or emergent issues, a gastroenterologist can be reached at any hour by calling [phone].   DIET:  We do recommend a small meal at first, but then you may proceed to your regular diet.  Drink plenty of fluids but you should avoid alcoholic beverages for 24 hours.  ACTIVITY:  You should plan to take it easy for the rest of today and you should NOT DRIVE or use heavy  machinery until tomorrow (because of the sedation medicines used during the test).    FOLLOW UP: Our staff will call the number listed on your records the next business day following your procedure to check on you and address any questions or concerns that you may have regarding the information given to you following your procedure. If we do not reach you, we will leave a message.  However, if you are feeling well and you are not experiencing any problems, there is no need to return our call.  We will assume that you have returned to your regular daily activities without incident.  If any biopsies were taken you will be contacted by phone or by letter within the next 1-3 weeks.  Please call us at [phone] if you have not heard about the biopsies in 3 weeks.    SIGNATURES/CONFIDENTIALITY: You and/or your care partner have signed paperwork which will be entered into your electronic medical record.  These signatures attest to the fact that that the information above on your After Visit Summary has been reviewed and is understood.  Full responsibility of the confidentiality of this discharge information lies with you and/or your care-partner. 

## 2016-12-20 NOTE — Op Note (Addendum)
[facility] Patient Name: Alexander Cervantes Procedure Date: 12/20/2016 9:06 AM MRN: [id number] Endoscopist: Milus Banister , MD Age: 56 Referring MD:  Date of Birth: Sep 13, 1961 Gender: Male Account #: [id number] Procedure:                Colonoscopy Indications:              Screening for colorectal malignant neoplasm, mother                            (probably) had colon cancer in her 43s Medicines:                Monitored Anesthesia Care Procedure:                Pre-Anesthesia Assessment:                           - Prior to the procedure, a History and Physical                            was performed, and patient medications and                            allergies were reviewed. The patient's tolerance of                            previous anesthesia was also reviewed. The risks                            and benefits of the procedure and the sedation                            options and risks were discussed with the patient.                            All questions were answered, and informed consent                            was obtained. Prior Anticoagulants: The patient has                            taken no previous anticoagulant or antiplatelet                            agents. ASA Grade Assessment: II - A patient with                            mild systemic disease. After reviewing the risks                            and benefits, the patient was deemed in                            satisfactory condition to undergo the procedure.  After obtaining informed consent, the colonoscope                            was passed under direct vision. Throughout the                            procedure, the patient's blood pressure, pulse, and                            oxygen saturations were monitored continuously. The                            Colonoscope was introduced through the anus and                            advanced to the the  cecum, identified by                            appendiceal orifice and ileocecal valve. The                            colonoscopy was performed without difficulty. The                            patient tolerated the procedure well. The quality                            of the bowel preparation was excellent. The                            ileocecal valve, appendiceal orifice, and rectum                            were photographed. Scope In: 9:10:38 AM Scope Out: 9:17:28 AM Scope Withdrawal Time: 0 hours 5 minutes 55 seconds  Total Procedure Duration: 0 hours 6 minutes 50 seconds  Findings:                 Two sessile polyps were found in the descending                            colon and ascending colon. The polyps were 2 to 3                            mm in size. These polyps were removed with a cold                            snare. Resection and retrieval were complete.                           The exam was otherwise without abnormality on                            direct and retroflexion views. Complications:  No immediate complications. Estimated blood loss:                            None. Estimated Blood Loss:     Estimated blood loss: none. Impression:               - Two 2 to 3 mm polyps in the descending colon and                            in the ascending colon, removed with a cold snare.                            Resected and retrieved.                           - The examination was otherwise normal on direct                            and retroflexion views. Recommendation:           - Patient has a contact number available for                            emergencies. The signs and symptoms of potential                            delayed complications were discussed with the                            patient. Return to normal activities tomorrow.                            Written discharge instructions were provided to the                             patient.                           - Resume previous diet.                           - Continue present medications.                           - We will arrange an office appointment to discuss                            the symptoms you brought up at the time of this                            visit (poor appetite, weight loss)                           You will receive a letter within 2-3 weeks with the  pathology results and my final recommendations.                           If the polyp(s) is proven to be 'pre-cancerous' on                            pathology, you will need repeat colonoscopy in 5                            years. Milus Banister, MD 12/20/2016 9:20:24 AM This report has been signed electronically.

## 2016-12-21 ENCOUNTER — Telehealth: Payer: Self-pay | Admitting: *Deleted

## 2016-12-21 NOTE — Telephone Encounter (Signed)
  Follow up Call-  Call back number 12/20/2016  Post procedure Call Back phone  # [phone]  Permission to leave phone message Yes  Some recent data might be hidden     Patient questions:  Do you have a fever, pain , or abdominal swelling? No. Pain Score  0 *  Have you tolerated food without any problems? Yes.    Have you been able to return to your normal activities? Yes.    Do you have any questions about your discharge instructions: Diet   No. Medications  No. Follow up visit  No.  Do you have questions or concerns about your Care? No.  Actions: * If pain score is 4 or above: No action needed, pain <4.

## 2016-12-28 ENCOUNTER — Encounter: Payer: Self-pay | Admitting: Gastroenterology

## 2017-01-03 ENCOUNTER — Ambulatory Visit (INDEPENDENT_AMBULATORY_CARE_PROVIDER_SITE_OTHER): Payer: PRIVATE HEALTH INSURANCE | Admitting: Gastroenterology

## 2017-01-03 ENCOUNTER — Encounter: Payer: Self-pay | Admitting: Gastroenterology

## 2017-01-03 ENCOUNTER — Other Ambulatory Visit: Payer: Self-pay

## 2017-01-03 ENCOUNTER — Other Ambulatory Visit (INDEPENDENT_AMBULATORY_CARE_PROVIDER_SITE_OTHER): Payer: PRIVATE HEALTH INSURANCE

## 2017-01-03 VITALS — BP 130/60 | HR 78 | Ht 66.0 in | Wt 141.0 lb

## 2017-01-03 DIAGNOSIS — R634 Abnormal weight loss: Secondary | ICD-10-CM

## 2017-01-03 DIAGNOSIS — R6881 Early satiety: Secondary | ICD-10-CM

## 2017-01-03 LAB — CBC WITH DIFFERENTIAL/PLATELET
BASOS PCT: 1.1 % (ref 0.0–3.0)
Basophils Absolute: 0 10*3/uL (ref 0.0–0.1)
Eosinophils Absolute: 0.1 10*3/uL (ref 0.0–0.7)
Eosinophils Relative: 1.6 % (ref 0.0–5.0)
HCT: 46.4 % (ref 39.0–52.0)
Hemoglobin: 15.8 g/dL (ref 13.0–17.0)
LYMPHS ABS: 0.8 10*3/uL (ref 0.7–4.0)
Lymphocytes Relative: 19.4 % (ref 12.0–46.0)
MCHC: 34.1 g/dL (ref 30.0–36.0)
MCV: 82.9 fl (ref 78.0–100.0)
MONO ABS: 0.4 10*3/uL (ref 0.1–1.0)
Monocytes Relative: 9.2 % (ref 3.0–12.0)
NEUTROS PCT: 68.7 % (ref 43.0–77.0)
Neutro Abs: 3 10*3/uL (ref 1.4–7.7)
PLATELETS: 192 10*3/uL (ref 150.0–400.0)
RBC: 5.59 Mil/uL (ref 4.22–5.81)
RDW: 13.6 % (ref 11.5–15.5)
WBC: 4.3 10*3/uL (ref 4.0–10.5)

## 2017-01-03 LAB — COMPREHENSIVE METABOLIC PANEL
ALK PHOS: 92 U/L (ref 39–117)
ALT: 32 U/L (ref 0–53)
AST: 17 U/L (ref 0–37)
Albumin: 4.5 g/dL (ref 3.5–5.2)
BILIRUBIN TOTAL: 0.6 mg/dL (ref 0.2–1.2)
BUN: 11 mg/dL (ref 6–23)
CALCIUM: 8.8 mg/dL (ref 8.4–10.5)
CO2: 30 mEq/L (ref 19–32)
Chloride: 103 mEq/L (ref 96–112)
Creatinine, Ser: 1 mg/dL (ref 0.40–1.50)
GFR: 82.17 mL/min (ref >60.00)
GLUCOSE: 108 mg/dL — AB (ref 70–99)
POTASSIUM: 4 meq/L (ref 3.5–5.1)
Sodium: 139 mEq/L (ref 135–145)
TOTAL PROTEIN: 6.9 g/dL (ref 6.0–8.3)

## 2017-01-03 LAB — IGA: IGA: 106 mg/dL (ref 68–378)

## 2017-01-03 LAB — TSH: TSH: 0.97 u[IU]/mL (ref 0.35–4.50)

## 2017-01-03 NOTE — Patient Instructions (Addendum)
You will have labs checked today in the basement lab.  Please head down after you check out with the front desk  (cbc, cmet, tTG, total IgA level, TSH). You will be set up for an upper endoscopy for weight loss, early satiety, post prandial abd pain). If you are age 56 or older, your body mass index should be between 23-30. Your Body mass index is 22.76 kg/m. If this is out of the aforementioned range listed, please consider follow up with your Primary Care Provider.  If you are age 77 or younger, your body mass index should be between 19-25. Your Body mass index is 22.76 kg/m. If this is out of the aformentioned range listed, please consider follow up with your Primary Care Provider.

## 2017-01-03 NOTE — Progress Notes (Signed)
Review of pertinent gastrointestinal problems: 1. Personal history of precancerous colon polyp and FH of colon cancer. Colonoscopy March 2018 done for family history of colon cancer, his mother had MAY have had colon cancer in her 34s.  2 sub-centimeter polyps were removed from the colon. These were adenomas. He was recommended to have repeat colonoscopy at five-year interval.   HPI: This is a  very pleasant 56 year old [place] man whom I met at the time of a colonoscopy earlier this month. That was a routine screening examination. After the exam he and his sister mentioned that he had been having postprandial pains, weight loss, early satiety and so we arranged for this new patient evaluation for those symptoms.  Chief complaint is early satiety, weight loss, postprandial abdominal discomfort  Losing weight; maybe ten pounds over several months.  Having to wear a belt   He is not usually eating as heavily as he used to.  Has early satiety.  He has pains in his abdomen post prandial.  Has been going on for 1-2 years.  20 min after eating he has a ton of bricks.  THis pressure lasts a long while.  Will get nauseas, vomit on occasion.  He gets contstipated at times, no overt bleeding. he really has no troubles with his bowels other than that. No significant diarrhea.    Pretty rare NSAIDs  He does not get heartburn except for very rarely. No fevers or chills.  No family history of stomach cancer    Review of systems: Pertinent positive and negative review of systems were noted in the above HPI section. Complete review of systems was performed and was otherwise normal.   Past Medical History:  Diagnosis Date  . Arthritis   . Hypertension   . Thyroid disease     Past Surgical History:  Procedure Laterality Date  . MASS EXCISION  08/01/2012   Procedure: EXCISION MASS;  Surgeon: Mcarthur Rossetti, MD;  Location: [facility];  Service: Orthopedics;  Laterality: Left;  Excision left  elbow mass    Current Outpatient Prescriptions  Medication Sig Dispense Refill  . amLODipine (NORVASC) 5 MG tablet Take 5 mg by mouth daily.    Marland Kitchen levothyroxine (SYNTHROID, LEVOTHROID) 50 MCG tablet Take 50 mcg by mouth daily before breakfast.      Current Facility-Administered Medications  Medication Dose Route Frequency Provider Last Rate Last Dose  . 0.9 %  sodium chloride infusion  500 mL Intravenous Continuous Milus Banister, MD        Allergies as of 01/03/2017  . (No Known Allergies)    Family History  Problem Relation Age of Onset  . Colon cancer Mother     in her mid 57's  . Heart attack Father   . Esophageal cancer Neg Hx   . Rectal cancer Neg Hx   . Stomach cancer Neg Hx     Social History   Social History  . Marital status: Divorced    Spouse name: N/A  . Number of children: 2  . Years of education: N/A   Occupational History  . diesel mechancial    Social History Main Topics  . Smoking status: Never Smoker  . Smokeless tobacco: Never Used  . Alcohol use No  . Drug use: No  . Sexual activity: Not on file   Other Topics Concern  . Not on file   Social History Narrative  . No narrative on file     Physical Exam: BP 130/60  Pulse 78   Ht '5\' 6"'$  (1.676 m)   Wt 141 lb (64 kg)   BMI 22.76 kg/m  Constitutional: generally well-appearing Psychiatric: alert and oriented x3 Eyes: extraocular movements intact Mouth: oral pharynx moist, no lesions Neck: supple no lymphadenopathy Cardiovascular: heart regular rate and rhythm Lungs: clear to auscultation bilaterally Abdomen: soft, nontender, nondistended, no obvious ascites, no peritoneal signs, normal bowel sounds Extremities: no lower extremity edema bilaterally Skin: no lesions on visible extremities   Assessment and plan: 56 y.o. male with  Early satiety, weight loss, postprandial abdominal discomfort  I recommended we proceed with EGD at his soonest convenience to check for peptic ulcer  disease, H. pylori, neoplasm. He is going to get a basic set of labs today, summarized in the patient instructions. He is [place] in [place] and so perhaps this is from underlying celiac sprue and I will send serologic tests for that as well today.    Please see the "Patient Instructions" section for addition details about the plan.   Owens Loffler, MD [facility] 01/03/2017, 1:54 PM

## 2017-01-06 LAB — TISSUE TRANSGLUTAMINASE ABS,IGG,IGA
Tissue Transglut Ab: 4 U/mL (ref <6)
Tissue Transglutaminase Ab, IgA: 1 U/mL (ref <4)

## 2017-01-21 ENCOUNTER — Encounter: Payer: Self-pay | Admitting: Gastroenterology

## 2017-02-01 ENCOUNTER — Encounter: Payer: Self-pay | Admitting: Gastroenterology

## 2017-02-01 ENCOUNTER — Ambulatory Visit (AMBULATORY_SURGERY_CENTER): Payer: PRIVATE HEALTH INSURANCE | Admitting: Gastroenterology

## 2017-02-01 VITALS — BP 121/87 | HR 69 | Temp 98.4°F | Resp 11 | Ht 66.0 in | Wt 141.0 lb

## 2017-02-01 DIAGNOSIS — R6881 Early satiety: Secondary | ICD-10-CM

## 2017-02-01 DIAGNOSIS — K449 Diaphragmatic hernia without obstruction or gangrene: Secondary | ICD-10-CM

## 2017-02-01 DIAGNOSIS — K2970 Gastritis, unspecified, without bleeding: Secondary | ICD-10-CM

## 2017-02-01 DIAGNOSIS — R634 Abnormal weight loss: Secondary | ICD-10-CM | POA: Diagnosis not present

## 2017-02-01 DIAGNOSIS — B9681 Helicobacter pylori [H. pylori] as the cause of diseases classified elsewhere: Secondary | ICD-10-CM | POA: Diagnosis not present

## 2017-02-01 MED ORDER — SODIUM CHLORIDE 0.9 % IV SOLN: 500.0000 mL | INTRAVENOUS | Status: DC

## 2017-02-01 NOTE — Progress Notes (Signed)
Report to PACU, RN, vss, BBS= Clear.  

## 2017-02-01 NOTE — Op Note (Signed)
[facility] Patient Name: Alexander Cervantes Procedure Date: 02/01/2017 10:15 AM MRN: [id number] Endoscopist: Milus Banister , MD Age: 56 Referring MD:  Date of Birth: 16-Jul-1961 Gender: Male Account #: [id number] Procedure:                Upper GI endoscopy Indications:              Dyspepsia, Early satiety, Weight loss Medicines:                Monitored Anesthesia Care Procedure:                Pre-Anesthesia Assessment:                           - Prior to the procedure, a History and Physical                            was performed, and patient medications and                            allergies were reviewed. The patient's tolerance of                            previous anesthesia was also reviewed. The risks                            and benefits of the procedure and the sedation                            options and risks were discussed with the patient.                            All questions were answered, and informed consent                            was obtained. Prior Anticoagulants: The patient has                            taken no previous anticoagulant or antiplatelet                            agents. ASA Grade Assessment: II - A patient with                            mild systemic disease. After reviewing the risks                            and benefits, the patient was deemed in                            satisfactory condition to undergo the procedure.                           After obtaining informed consent, the endoscope was  passed under direct vision. Throughout the                            procedure, the patient's blood pressure, pulse, and                            oxygen saturations were monitored continuously. The                            Endoscope was introduced through the mouth, and                            advanced to the second part of duodenum. The upper                            GI endoscopy was  accomplished without difficulty.                            The patient tolerated the procedure well. Scope In: Scope Out: Findings:                 The esophagus was normal.                           Diffuse moderate inflammation characterized by                            congestion (edema), erythema and granularity was                            found in the entire examined stomach. Biopsies were                            taken with a cold forceps for histology. Also a                            small hiatal hernia.                           The examined duodenum was normal. Complications:            No immediate complications. Estimated blood loss:                            None. Estimated Blood Loss:     Estimated blood loss: none. Impression:               - Moderate pan-gastritis and a small hiatal hernia.                            Biopsies taken to check for H. pylori. Recommendation:           - Resume previous diet.                           - Continue present medications.                           -  Await pathology results. Milus Banister, MD 02/01/2017 10:40:15 AM This report has been signed electronically.

## 2017-02-01 NOTE — Patient Instructions (Signed)
Impression/recommendations:  Gastritis (handout given) Hiatal Hernia (handout given)  YOU HAD AN ENDOSCOPIC PROCEDURE TODAY AT [facility]:   Refer to the procedure report that was given to you for any specific questions about what was found during the examination.  If the procedure report does not answer your questions, please call your gastroenterologist to clarify.  If you requested that your care partner not be given the details of your procedure findings, then the procedure report has been included in a sealed envelope for you to review at your convenience later.  YOU SHOULD EXPECT: Some feelings of bloating in the abdomen. Passage of more gas than usual.  Walking can help get rid of the air that was put into your GI tract during the procedure and reduce the bloating. If you had a lower endoscopy (such as a colonoscopy or flexible sigmoidoscopy) you may notice spotting of blood in your stool or on the toilet paper. If you underwent a bowel prep for your procedure, you may not have a normal bowel movement for a few days.  Please Note:  You might notice some irritation and congestion in your nose or some drainage.  This is from the oxygen used during your procedure.  There is no need for concern and it should clear up in a day or so.  SYMPTOMS TO REPORT IMMEDIATELY:   Following upper endoscopy (EGD)  Vomiting of blood or coffee ground material  New chest pain or pain under the shoulder blades  Painful or persistently difficult swallowing  New shortness of breath  Fever of 100F or higher  Black, tarry-looking stools  For urgent or emergent issues, a gastroenterologist can be reached at any hour by calling [phone].   DIET:  We do recommend a small meal at first, but then you may proceed to your regular diet.  Drink plenty of fluids but you should avoid alcoholic beverages for 24 hours.  ACTIVITY:  You should plan to take it easy for the rest of today and you  should NOT DRIVE or use heavy machinery until tomorrow (because of the sedation medicines used during the test).    FOLLOW UP: Our staff will call the number listed on your records the next business day following your procedure to check on you and address any questions or concerns that you may have regarding the information given to you following your procedure. If we do not reach you, we will leave a message.  However, if you are feeling well and you are not experiencing any problems, there is no need to return our call.  We will assume that you have returned to your regular daily activities without incident.  If any biopsies were taken you will be contacted by phone or by letter within the next 1-3 weeks.  Please call us at [phone] if you have not heard about the biopsies in 3 weeks.    SIGNATURES/CONFIDENTIALITY: You and/or your care partner have signed paperwork which will be entered into your electronic medical record.  These signatures attest to the fact that that the information above on your After Visit Summary has been reviewed and is understood.  Full responsibility of the confidentiality of this discharge information lies with you and/or your care-partner.

## 2017-02-01 NOTE — Progress Notes (Signed)
Called to room to assist during endoscopic procedure.  Patient ID and intended procedure confirmed with present staff. Received instructions for my participation in the procedure from the performing physician.  

## 2017-02-02 ENCOUNTER — Telehealth: Payer: Self-pay | Admitting: *Deleted

## 2017-02-02 NOTE — Telephone Encounter (Signed)
  Follow up Call-  Call back number 02/01/2017 12/20/2016  Post procedure Call Back phone  # [phone]  Permission to leave phone message Yes Yes  Some recent data might be hidden     Patient questions:  Do you have a fever, pain , or abdominal swelling? No. Pain Score  0 *  Have you tolerated food without any problems? Yes.    Have you been able to return to your normal activities? Yes.    Do you have any questions about your discharge instructions: Diet   No. Medications  No. Follow up visit  No.  Do you have questions or concerns about your Care? No.  Actions: * If pain score is 4 or above: No action needed, pain <4.

## 2017-02-07 ENCOUNTER — Telehealth: Payer: Self-pay | Admitting: Gastroenterology

## 2017-02-07 MED ORDER — BIS SUBCIT-METRONID-TETRACYC 140-125-125 MG PO CAPS: 3.0000 | ORAL_CAPSULE | Freq: Three times a day (TID) | ORAL | 0 refills | Status: DC

## 2017-02-07 NOTE — Telephone Encounter (Signed)
Pt has been notified and prescription has been sent to the pharmacy.  He will also take omeprazole twice daily for 14 days.  He will call back in 6-8 weeks to update on his condition

## 2017-02-07 NOTE — Telephone Encounter (Signed)
Pt returning Patty's call about path results from colon on 4.24.18

## 2017-02-07 NOTE — Telephone Encounter (Signed)
Alexander Cervantes,  Biopsies from stomach are positive for H. Pylori. Please call in PMPT 14 day therapy: First try 14 day course of Pylera. At the same time they need to be on twice daily PPI. He needs to call back in 6-8 weeks to report on his response.  Santiago Glad, no result note is needed.

## 2017-02-25 ENCOUNTER — Telehealth: Payer: Self-pay | Admitting: Gastroenterology

## 2017-02-25 NOTE — Telephone Encounter (Signed)
Patient having continued abdominal pain.  He completed Pylera last week.  Do you want him to resume his omeprazole?

## 2017-03-02 NOTE — Telephone Encounter (Signed)
Left message on machine to call back  

## 2017-03-02 NOTE — Telephone Encounter (Signed)
Yes, he should resume it, twice daily for now,  rov with me in 6-7 weeks.  Thanks

## 2017-03-02 NOTE — Telephone Encounter (Signed)
Pt states he is doing very well and will continue to take omeprazole twice daily and will call back to set up appt for follow up.

## 2017-03-08 ENCOUNTER — Encounter (HOSPITAL_COMMUNITY): Payer: Self-pay

## 2017-03-08 DIAGNOSIS — Z79899 Other long term (current) drug therapy: Secondary | ICD-10-CM | POA: Diagnosis not present

## 2017-03-08 DIAGNOSIS — I10 Essential (primary) hypertension: Secondary | ICD-10-CM | POA: Insufficient documentation

## 2017-03-08 DIAGNOSIS — R109 Unspecified abdominal pain: Secondary | ICD-10-CM | POA: Insufficient documentation

## 2017-03-08 NOTE — ED Notes (Signed)
Pt c/o sudden onset left flank pain at 1900 today. Pt was nauseated and diaphoretic. Pt currently at 7/10 pain.

## 2017-03-09 ENCOUNTER — Emergency Department (HOSPITAL_COMMUNITY)
Admission: EM | Admit: 2017-03-09 | Discharge: 2017-03-09 | Disposition: A | Discharge status: Home / Self Care | Payer: PRIVATE HEALTH INSURANCE | Attending: Emergency Medicine | Admitting: Emergency Medicine

## 2017-03-09 ENCOUNTER — Emergency Department (HOSPITAL_COMMUNITY): Payer: PRIVATE HEALTH INSURANCE

## 2017-03-09 DIAGNOSIS — R109 Unspecified abdominal pain: Secondary | ICD-10-CM

## 2017-03-09 LAB — CBC
HCT: 45.7 % (ref 39.0–52.0)
Hemoglobin: 15.8 g/dL (ref 13.0–17.0)
MCH: 28.5 pg (ref 26.0–34.0)
MCHC: 34.6 g/dL (ref 30.0–36.0)
MCV: 82.3 fL (ref 78.0–100.0)
PLATELETS: 176 10*3/uL (ref 150–400)
RBC: 5.55 MIL/uL (ref 4.22–5.81)
RDW: 13.5 % (ref 11.5–15.5)
WBC: 5.6 10*3/uL (ref 4.0–10.5)

## 2017-03-09 LAB — URINALYSIS, ROUTINE W REFLEX MICROSCOPIC
Bilirubin Urine: NEGATIVE
GLUCOSE, UA: 50 mg/dL — AB
Ketones, ur: NEGATIVE mg/dL
LEUKOCYTES UA: NEGATIVE
Nitrite: NEGATIVE
Protein, ur: NEGATIVE mg/dL
SPECIFIC GRAVITY, URINE: 1.013 (ref 1.005–1.030)
pH: 6 (ref 5.0–8.0)

## 2017-03-09 LAB — BASIC METABOLIC PANEL
ANION GAP: 7 (ref 5–15)
BUN: 17 mg/dL (ref 6–20)
CALCIUM: 8.8 mg/dL — AB (ref 8.9–10.3)
CHLORIDE: 105 mmol/L (ref 101–111)
CO2: 27 mmol/L (ref 22–32)
CREATININE: 1.05 mg/dL (ref 0.61–1.24)
GFR calc Af Amer: >60 mL/min (ref >60)
GLUCOSE: 111 mg/dL — AB (ref 65–99)
POTASSIUM: 3.8 mmol/L (ref 3.5–5.1)
SODIUM: 139 mmol/L (ref 135–145)

## 2017-03-09 MED ORDER — HYDROCODONE-ACETAMINOPHEN 5-325 MG PO TABS: 1.0000 | ORAL_TABLET | Freq: Four times a day (QID) | ORAL | 0 refills | Status: DC | PRN

## 2017-03-09 MED ORDER — KETOROLAC TROMETHAMINE 30 MG/ML IJ SOLN
30.0000 mg | Freq: Once | INTRAMUSCULAR | Status: AC
  Administered 2017-03-09: 30 mg via INTRAVENOUS
  Filled 2017-03-09: qty 1

## 2017-03-09 MED ORDER — ONDANSETRON HCL 4 MG/2ML IJ SOLN
4.0000 mg | Freq: Once | INTRAMUSCULAR | Status: AC
  Administered 2017-03-09: 4 mg via INTRAVENOUS
  Filled 2017-03-09: qty 2

## 2017-03-09 MED ORDER — IBUPROFEN 600 MG PO TABS
600.0000 mg | ORAL_TABLET | Freq: Four times a day (QID) | ORAL | 0 refills | Status: DC | PRN
Start: 2017-03-09 — End: 2017-03-15

## 2017-03-09 MED ORDER — KETOROLAC TROMETHAMINE 60 MG/2ML IM SOLN: 60.0000 mg | Freq: Once | INTRAMUSCULAR | Status: DC

## 2017-03-09 NOTE — Discharge Instructions (Signed)
We recommend pain control with ibuprofen. You may supplement this with Norco, as needed. Follow-up with your primary care doctor regarding your visit today. You may return for new or concerning symptoms.

## 2017-03-09 NOTE — ED Provider Notes (Signed)
[facility] DEPT Provider Note   CSN: [id number] Arrival date & time: 03/08/17  2306     History   Chief Complaint Chief Complaint  Patient presents with  . Flank Pain    left    HPI Alexander Cervantes is a 56 y.o. male.  56 year old male with a history of hypertension, thyroid disease, and arthritis presents to the emergency department for acute onset left mid abdominal pain. Symptoms have been waxing and waning in severity. They worsened while at work this evening. Patient denies any radiation of his pain. No medications taken prior to arrival for symptoms. He has had associated nausea as well as mild diaphoresis when his pain was severe. He denies known fever, urinary frequency, urgency, dysuria, hematuria, and bowel changes. No vomiting. He states that his symptoms feel similar to when he had a kidney stone. This was last in 2011 and passed without intervention.      Past Medical History:  Diagnosis Date  . Arthritis   . Hypertension   . Thyroid disease     Patient Active Problem List   Diagnosis Date Noted  . Viral syndrome 09/19/2014  . left arm mass 07/20/2012    Past Surgical History:  Procedure Laterality Date  . MASS EXCISION  08/01/2012   Procedure: EXCISION MASS;  Surgeon: Mcarthur Rossetti, MD;  Location: [facility];  Service: Orthopedics;  Laterality: Left;  Excision left elbow mass       Home Medications    Prior to Admission medications   Medication Sig Start Date End Date Taking? Authorizing Provider  amLODipine (NORVASC) 5 MG tablet Take 5 mg by mouth daily.   Yes [provider]  levothyroxine (SYNTHROID, LEVOTHROID) 50 MCG tablet Take 50 mcg by mouth daily before breakfast.    Yes [provider]  bismuth-metronidazole-tetracycline (PYLERA) 140-125-125 MG capsule Take 3 capsules by mouth 4 (four) times daily -  before meals and at bedtime. Patient not taking: Reported on 03/09/2017 02/07/17 02/21/17  Milus Banister, MD    HYDROcodone-acetaminophen (NORCO/VICODIN) 5-325 MG tablet Take 1-2 tablets by mouth every 6 (six) hours as needed for severe pain. 03/09/17   Antonietta Breach, PA-C  ibuprofen (ADVIL,MOTRIN) 600 MG tablet Take 1 tablet (600 mg total) by mouth every 6 (six) hours as needed. 03/09/17   Antonietta Breach, PA-C    Family History Family History  Problem Relation Age of Onset  . Colon cancer Mother        in her mid 50's  . Heart attack Father   . Esophageal cancer Neg Hx   . Rectal cancer Neg Hx   . Stomach cancer Neg Hx     Social History Social History  Substance Use Topics  . Smoking status: Never Smoker  . Smokeless tobacco: Never Used  . Alcohol use No     Allergies   Patient has no known allergies.   Review of Systems Review of Systems Ten systems reviewed and are negative for acute change, except as noted in the HPI.    Physical Exam Updated Vital Signs BP (!) 144/97 (BP Location: Left Arm)   Pulse 66   Temp 98.2 F (36.8 C) (Oral)   Resp 20   SpO2 100%   Physical Exam  Constitutional: He is oriented to person, place, and time. He appears well-developed and well-nourished. No distress.  Nontoxic and in NAD  HENT:  Head: Normocephalic and atraumatic.  Eyes: Conjunctivae and EOM are normal. No scleral icterus.  Neck:  Normal range of motion.  Cardiovascular: Normal rate, regular rhythm and intact distal pulses.   Pulmonary/Chest: Effort normal. No respiratory distress. He has no wheezes.  Respirations even and unlabored  Abdominal: Soft. He exhibits no mass. There is tenderness. There is no guarding.  Soft, nondistended abdomen with TTP in the left mid abdomen.  Musculoskeletal: Normal range of motion.  Neurological: He is alert and oriented to person, place, and time. He exhibits normal muscle tone. Coordination normal.  GCS 15. Patient moving all extremities.  Skin: Skin is warm and dry. No rash noted. He is not diaphoretic. No erythema. No pallor.  Psychiatric: He  has a normal mood and affect. His behavior is normal.  Nursing note and vitals reviewed.    ED Treatments / Results  Labs (all labs ordered are listed, but only abnormal results are displayed) Labs Reviewed  URINALYSIS, ROUTINE W REFLEX MICROSCOPIC - Abnormal; Notable for the following:       Result Value   Color, Urine STRAW (*)    Glucose, UA 50 (*)    Hgb urine dipstick MODERATE (*)    All other components within normal limits  BASIC METABOLIC PANEL - Abnormal; Notable for the following:    Glucose, Bld 111 (*)    Calcium 8.8 (*)    All other components within normal limits  CBC    EKG  EKG Interpretation None       Radiology Ct Renal Stone Study  Result Date: 03/09/2017 CLINICAL DATA:  LEFT flank pain, nausea. History of hypertension and kidney stones. EXAM: CT ABDOMEN AND PELVIS WITHOUT CONTRAST TECHNIQUE: Multidetector CT imaging of the abdomen and pelvis was performed following the standard protocol without IV contrast. COMPARISON:  CT abdomen and pelvis September 29, 2010 FINDINGS: LOWER CHEST: 5 mm RIGHT lower lobe and 3 mm LEFT lower lobe pulmonary nodules are stable from 2011. HEPATOBILIARY: Normal. PANCREAS: Normal. SPLEEN: Stable borderline splenomegaly from 2011.  Foot- ADRENALS/URINARY TRACT: Kidneys are orthotopic, demonstrating normal size and morphology. 4 mm LEFT lower pole, 2 mm LEFT lower pole No hydronephrosis; limited assessment for renal masses on this nonenhanced examination. Bilateral parapelvic cysts. The unopacified ureters are normal in course and caliber. Urinary bladder is partially distended and unremarkable. Normal adrenal glands. STOMACH/BOWEL: The stomach, small and large bowel are normal in course and caliber without inflammatory changes, sensitivity decreased by lack of enteric contrast. Moderate sigmoid diverticulosis. Tiny appendicolith, appendix is otherwise unremarkable. VASCULAR/LYMPHATIC: Aortoiliac vessels are normal in course and caliber. No  lymphadenopathy by CT size criteria. REPRODUCTIVE: Normal. OTHER: No intraperitoneal free fluid or free air. MUSCULOSKELETAL: Non-acute. IMPRESSION: Nonobstructing LEFT nephrolithiasis measure to 4 mm. Bilateral renal parapelvic cysts. No acute intra-abdominal or pelvic process by noncontrast CT. Electronically Signed   By: Elon Alas M.D.   On: 03/09/2017 03:31    Procedures Procedures (including critical care time)  Medications Ordered in ED Medications  ketorolac (TORADOL) 30 MG/ML injection 30 mg (30 mg Intravenous Given 03/09/17 0322)  ondansetron (ZOFRAN) injection 4 mg (4 mg Intravenous Given 03/09/17 0326)     Initial Impression / Assessment and Plan / ED Course  I have reviewed the triage vital signs and the nursing notes.  Pertinent labs & imaging results that were available during my care of the patient were reviewed by me and considered in my medical decision making (see chart for details).     56 year old male presents to the emergency department for evaluation of sudden onset left mid to lower abdominal pain.  Associated with diaphoresis as well as nausea. Patient has noted similar symptoms in the past associated with a kidney stone. He denies any urinary symptoms today. No fevers or vomiting. No medications taken prior to arrival.  Urinalysis with moderate hemoglobin. Kidney function preserved. Patient without leukocytosis. Laboratory workup is, overall, reassuring. CT renal stone study performed to evaluate for kidney stone. CT shows nonobstructing stones in the left kidney. No hydronephrosis or evidence of obstructing ureterolithiasis.  On repeat assessment, patient reports improvement in his pain. He has been treated with Toradol. I have discussed the possibility of a small kidney stone not visible on CT. Given reassuring workup and imaging, I believe outpatient pain control is appropriate. Primary care follow-up advised and return precautions given. Patient discharged  in stable condition with no unaddressed concerns.   Final Clinical Impressions(s) / ED Diagnoses   Final diagnoses:  Abdominal pain, unspecified abdominal location    New Prescriptions Discharge Medication List as of 03/09/2017  5:00 AM    START taking these medications   Details  HYDROcodone-acetaminophen (NORCO/VICODIN) 5-325 MG tablet Take 1-2 tablets by mouth every 6 (six) hours as needed for severe pain., Starting Wed 03/09/2017, Print    ibuprofen (ADVIL,MOTRIN) 600 MG tablet Take 1 tablet (600 mg total) by mouth every 6 (six) hours as needed., Starting Wed 03/09/2017, Print         Antonietta Breach, PA-C 03/09/17 0630    Merryl Hacker, MD 03/12/17 2256

## 2017-03-15 ENCOUNTER — Encounter (HOSPITAL_COMMUNITY): Payer: Self-pay | Admitting: Emergency Medicine

## 2017-03-15 ENCOUNTER — Emergency Department (HOSPITAL_COMMUNITY)
Admission: EM | Admit: 2017-03-15 | Discharge: 2017-03-15 | Disposition: A | Discharge status: Home / Self Care | Payer: 59 | Attending: Emergency Medicine | Admitting: Emergency Medicine

## 2017-03-15 ENCOUNTER — Emergency Department (HOSPITAL_COMMUNITY): Payer: 59

## 2017-03-15 DIAGNOSIS — R109 Unspecified abdominal pain: Secondary | ICD-10-CM | POA: Diagnosis present

## 2017-03-15 DIAGNOSIS — I10 Essential (primary) hypertension: Secondary | ICD-10-CM | POA: Insufficient documentation

## 2017-03-15 LAB — CBC
HCT: 41.3 % (ref 39.0–52.0)
Hemoglobin: 14.6 g/dL (ref 13.0–17.0)
MCH: 28.7 pg (ref 26.0–34.0)
MCHC: 35.4 g/dL (ref 30.0–36.0)
MCV: 81.1 fL (ref 78.0–100.0)
PLATELETS: 169 10*3/uL (ref 150–400)
RBC: 5.09 MIL/uL (ref 4.22–5.81)
RDW: 13.3 % (ref 11.5–15.5)
WBC: 3.3 10*3/uL — AB (ref 4.0–10.5)

## 2017-03-15 LAB — COMPREHENSIVE METABOLIC PANEL
ALBUMIN: 4.1 g/dL (ref 3.5–5.0)
ALT: 18 U/L (ref 17–63)
AST: 20 U/L (ref 15–41)
Alkaline Phosphatase: 88 U/L (ref 38–126)
Anion gap: 7 (ref 5–15)
BUN: 16 mg/dL (ref 6–20)
CHLORIDE: 112 mmol/L — AB (ref 101–111)
CO2: 22 mmol/L (ref 22–32)
Calcium: 8.7 mg/dL — ABNORMAL LOW (ref 8.9–10.3)
Creatinine, Ser: 1.16 mg/dL (ref 0.61–1.24)
GFR calc Af Amer: >60 mL/min (ref >60)
GFR calc non Af Amer: >60 mL/min (ref >60)
GLUCOSE: 153 mg/dL — AB (ref 65–99)
POTASSIUM: 3.5 mmol/L (ref 3.5–5.1)
Sodium: 141 mmol/L (ref 135–145)
Total Bilirubin: 0.9 mg/dL (ref 0.3–1.2)
Total Protein: 6.7 g/dL (ref 6.5–8.1)

## 2017-03-15 LAB — URINALYSIS, ROUTINE W REFLEX MICROSCOPIC
BILIRUBIN URINE: NEGATIVE
Glucose, UA: NEGATIVE mg/dL
HGB URINE DIPSTICK: NEGATIVE
Ketones, ur: NEGATIVE mg/dL
Leukocytes, UA: NEGATIVE
Nitrite: NEGATIVE
PROTEIN: NEGATIVE mg/dL
Specific Gravity, Urine: 1.016 (ref 1.005–1.030)
pH: 5 (ref 5.0–8.0)

## 2017-03-15 MED ORDER — TAMSULOSIN HCL 0.4 MG PO CAPS: 0.4000 mg | ORAL_CAPSULE | Freq: Every day | ORAL | 0 refills | Status: DC

## 2017-03-15 MED ORDER — KETOROLAC TROMETHAMINE 30 MG/ML IJ SOLN
30.0000 mg | Freq: Once | INTRAMUSCULAR | Status: AC
  Administered 2017-03-15: 30 mg via INTRAMUSCULAR
  Filled 2017-03-15: qty 1

## 2017-03-15 MED ORDER — HYDROCODONE-ACETAMINOPHEN 5-325 MG PO TABS: 1.0000 | ORAL_TABLET | Freq: Four times a day (QID) | ORAL | 0 refills | Status: DC | PRN

## 2017-03-15 MED ORDER — IBUPROFEN 800 MG PO TABS: 800.0000 mg | ORAL_TABLET | Freq: Three times a day (TID) | ORAL | 0 refills | Status: DC | PRN

## 2017-03-15 NOTE — ED Provider Notes (Signed)
Emergency Department Provider Note   I have reviewed the triage vital signs and the nursing notes.   HISTORY  Chief Complaint Flank Pain   HPI Alexander Cervantes is a 56 y.o. male with PMH of HTN and prior kidney stones presents to the ED for evaluation of acute worsening of left flank pain. He was seen in the ED 6 days prior with left flank pain similar to prior stones. His pain improved with Vicodin but returned today. No fever, chills, dysuria, or hematuria. No anterior abdominal pain. He has a CT at this time which showed non-obstructing renal stones. Does not have Urologist. Reports never requiring Urology intervention to pass stone. Pain is intermittent, severe, and radiating slightly downwards.    Past Medical History:  Diagnosis Date  . Arthritis   . Hypertension   . Renal disorder   . Thyroid disease     Patient Active Problem List   Diagnosis Date Noted  . Viral syndrome 09/19/2014  . left arm mass 07/20/2012    Past Surgical History:  Procedure Laterality Date  . MASS EXCISION  08/01/2012   Procedure: EXCISION MASS;  Surgeon: Mcarthur Rossetti, MD;  Location: [facility];  Service: Orthopedics;  Laterality: Left;  Excision left elbow mass    Current Outpatient Rx  . Order #: [id number] Class: Historical Med  . Order #: [id number] Class: Historical Med  . Order #: [id number] Class: Normal  . Order #: [id number] Class: Print  . Order #: [id number] Class: Print  . Order #: [id number] Class: Print    Allergies Patient has no known allergies.  Family History  Problem Relation Age of Onset  . Colon cancer Mother        in her mid 84's  . Heart attack Father   . Esophageal cancer Neg Hx   . Rectal cancer Neg Hx   . Stomach cancer Neg Hx     Social History Social History  Substance Use Topics  . Smoking status: Never Smoker  . Smokeless tobacco: Never Used  . Alcohol use No    Review of Systems  Constitutional: No fever/chills Eyes: No visual changes. ENT:  No sore throat. Cardiovascular: Denies chest pain. Respiratory: Denies shortness of breath. Gastrointestinal: No abdominal pain.  No nausea, no vomiting.  No diarrhea.  No constipation. Genitourinary: Negative for dysuria. Positive left flank pain.  Musculoskeletal: Negative for back pain. Skin: Negative for rash. Neurological: Negative for headaches, focal weakness or numbness.  10-point ROS otherwise negative.  ____________________________________________   PHYSICAL EXAM:  VITAL SIGNS: ED Triage Vitals  Enc Vitals Group     BP 03/15/17 1411 (!) 151/103     Pulse Rate 03/15/17 1411 89     Resp 03/15/17 1411 20     Temp 03/15/17 1411 97.9 F (36.6 C)     Temp Source 03/15/17 1411 Oral     SpO2 03/15/17 1411 100 %     Weight 03/15/17 1411 138 lb (62.6 kg)     Height 03/15/17 1411 5\' 6"  (1.676 m)     Pain Score 03/15/17 1419 10   Constitutional: Alert and oriented. Well appearing but slightly uncomfortable.  Eyes: Conjunctivae are normal.  Head: Atraumatic. Nose: No congestion/rhinnorhea. Mouth/Throat: Mucous membranes are moist.   Neck: No stridor.  Cardiovascular: Normal rate, regular rhythm. Good peripheral circulation. Grossly normal heart sounds.   Respiratory: Normal respiratory effort.  No retractions. Lungs CTAB. Gastrointestinal: Soft and nontender. No distention.  Musculoskeletal: No lower extremity tenderness nor edema. No gross deformities of extremities.  Neurologic:  Normal speech and language. No gross focal neurologic deficits are appreciated.  Skin:  Skin is warm, dry and intact. No rash noted.  ____________________________________________   LABS (all labs ordered are listed, but only abnormal results are displayed)  Labs Reviewed  CBC - Abnormal; Notable for the following:       Result Value   WBC 3.3 (*)    All other components within normal limits  COMPREHENSIVE METABOLIC PANEL - Abnormal; Notable for the following:    Chloride 112 (*)     Glucose, Bld 153 (*)    Calcium 8.7 (*)    All other components within normal limits  URINALYSIS, ROUTINE W REFLEX MICROSCOPIC   ____________________________________________  RADIOLOGY  Dg Abdomen 1 View  Result Date: 03/15/2017 CLINICAL DATA:  Left-sided abdominal pain 1 week. EXAM: ABDOMEN - 1 VIEW COMPARISON:  CT abdomen 03/09/2017 FINDINGS: Bowel gas pattern is nonobstructive. No evidence of free peritoneal air. No mass or mass effect. Oval densities over the stomach and right colon, compatible with ingested pills. Small left-sided phlebolith. Three-4 mm calcification over the left pelvis not seen in this area on the recent CT scan and may represent propagation of 1 of patient's known left renal stones. Bony structures are within normal. IMPRESSION: Nonobstructive bowel gas pattern. 3-4 mm calcification over the left pelvis not seen on recent CT and may represent movement of 1 of patient's known left renal stones into the distal left ureter. Electronically Signed   By: Marin Olp M.D.   On: 03/15/2017 15:25    ____________________________________________   PROCEDURES  Procedure(s) performed:   Procedures  None ____________________________________________   INITIAL IMPRESSION / ASSESSMENT AND PLAN / ED COURSE  Pertinent labs & imaging results that were available during my care of the patient were reviewed by me and considered in my medical decision making (see chart for details).  Normal labs and UA. KUB shows migration of one of the stones to the distal ureter on the left. No indication for repeat CT at this time. Patient's pain is well controlled with Toradol. Discussed starting Flomax, NSAIDs, and Norco for breakthrough pain. Discussed return precautions for urosepsis in detail. Provided follow up information for [facility] to establish care.   At this time, I do not feel there is any life-threatening condition present. I have reviewed and discussed all results (EKG,  imaging, lab, urine as appropriate), exam findings with patient. I have reviewed nursing notes and appropriate previous records.  I feel the patient is safe to be discharged home without further emergent workup. Discussed usual and customary return precautions. Patient and family (if present) verbalize understanding and are comfortable with this plan.  Patient will follow-up with their primary care provider. If they do not have a primary care provider, information for follow-up has been provided to them. All questions have been answered.    ____________________________________________  FINAL CLINICAL IMPRESSION(S) / ED DIAGNOSES  Final diagnoses:  Left flank pain     MEDICATIONS GIVEN DURING THIS VISIT:  Medications  ketorolac (TORADOL) 30 MG/ML injection 30 mg (30 mg Intramuscular Given 03/15/17 1526)     NEW OUTPATIENT MEDICATIONS STARTED DURING THIS VISIT:  Discharge Medication List as of 03/15/2017  4:18 PM    START taking these medications   Details  tamsulosin (FLOMAX) 0.4 MG CAPS capsule Take 1 capsule (0.4 mg total) by mouth daily., Starting Tue 03/15/2017, Print       Norco 5-325 mg (refill) Motrin 800 mg (refill)  Note:  This document was prepared using Dragon voice recognition software and may include unintentional dictation errors.  Nanda Quinton, MD Emergency Medicine   Dahmir Epperly, [facility], MD 03/15/17 Lurline Hare

## 2017-03-15 NOTE — Discharge Instructions (Signed)
You have been seen in the Emergency Department (ED) today for pain that we believe based on your workup, is caused by kidney stones.  As we have discussed, please drink plenty of fluids.  Please make a follow up appointment with the physician(s) listed elsewhere in this documentation. ° °You may take pain medication as needed but ONLY as prescribed.  Please also take your prescribed Flomax daily.  We also recommend that you take over-the-counter ibuprofen regularly according to label instructions over the next 5 days.  Take it with meals to minimize stomach discomfort. ° °Please see your doctor as soon as possible as stones may take 1-3 weeks to pass and you may require additional care or medications. ° °Do not drink alcohol, drive or participate in any other potentially dangerous activities while taking opiate pain medication as it may make you sleepy. Do not take this medication with any other sedating medications, either prescription or over-the-counter. If you were prescribed Percocet or Vicodin, do not take these with acetaminophen (Tylenol) as it is already contained within these medications. °  °Take Vicodin as needed for severe pain.  This medication is an opiate (or narcotic) pain medication and can be habit forming.  Use it as little as possible to achieve adequate pain control.  Do not use or use it with extreme caution if you have a history of opiate abuse or dependence.  If you are on a pain contract with your primary care doctor or a pain specialist, be sure to let them know you were prescribed this medication today from the Emergency Department.  This medication is intended for your use only - do not give any to anyone else and keep it in a secure place where nobody else, especially children, have access to it.  It will also cause or worsen constipation, so you may want to consider taking an over-the-counter stool softener while you are taking this medication. ° °Return to the Emergency Department  (ED) or call your doctor if you have any worsening pain, fever, painful urination, are unable to urinate, or develop other symptoms that concern you. ° ° ° °Kidney Stones °Kidney stones (urolithiasis) are deposits that form inside your kidneys. The intense pain is caused by the stone moving through the urinary tract. When the stone moves, the ureter goes into spasm around the stone. The stone is usually passed in the urine.  °CAUSES  °A disorder that makes certain neck glands produce too much parathyroid hormone (primary hyperparathyroidism). °A buildup of uric acid crystals, similar to gout in your joints. °Narrowing (stricture) of the ureter. °A kidney obstruction present at birth (congenital obstruction). °Previous surgery on the kidney or ureters. °Numerous kidney infections. °SYMPTOMS  °Feeling sick to your stomach (nauseous). °Throwing up (vomiting). °Blood in the urine (hematuria). °Pain that usually spreads (radiates) to the groin. °Frequency or urgency of urination. °DIAGNOSIS  °Taking a history and physical exam. °Blood or urine tests. °CT scan. °Occasionally, an examination of the inside of the urinary bladder (cystoscopy) is performed. °TREATMENT  °Observation. °Increasing your fluid intake. °Extracorporeal shock wave lithotripsy--This is a noninvasive procedure that uses shock waves to break up kidney stones. °Surgery may be needed if you have severe pain or persistent obstruction. There are various surgical procedures. Most of the procedures are performed with the use of small instruments. Only small incisions are needed to accommodate these instruments, so recovery time is minimized. °The size, location, and chemical composition are all important variables that will determine the proper   choice of action for you. Talk to your health care provider to better understand your situation so that you will minimize the risk of injury to yourself and your kidney.  °HOME CARE INSTRUCTIONS  °Drink enough water  and fluids to keep your urine clear or pale yellow. This will help you to pass the stone or stone fragments. °Strain all urine through the provided strainer. Keep all particulate matter and stones for your health care provider to see. The stone causing the pain may be as small as a grain of salt. It is very important to use the strainer each and every time you pass your urine. The collection of your stone will allow your health care provider to analyze it and verify that a stone has actually passed. The stone analysis will often identify what you can do to reduce the incidence of recurrences. °Only take over-the-counter or prescription medicines for pain, discomfort, or fever as directed by your health care provider. °Keep all follow-up visits as told by your health care provider. This is important. °Get follow-up X-rays if required. The absence of pain does not always mean that the stone has passed. It may have only stopped moving. If the urine remains completely obstructed, it can cause loss of kidney function or even complete destruction of the kidney. It is your responsibility to make sure X-rays and follow-ups are completed. Ultrasounds of the kidney can show blockages and the status of the kidney. Ultrasounds are not associated with any radiation and can be performed easily in a matter of minutes. °Make changes to your daily diet as told by your health care provider. You may be told to: °Limit the amount of salt that you eat. °Eat 5 or more servings of fruits and vegetables each day. °Limit the amount of meat, poultry, fish, and eggs that you eat. °Collect a 24-hour urine sample as told by your health care provider. You may need to collect another urine sample every 6-12 months. °SEEK MEDICAL CARE IF: °You experience pain that is progressive and unresponsive to any pain medicine you have been prescribed. °SEEK IMMEDIATE MEDICAL CARE IF:  °Pain cannot be controlled with the prescribed medicine. °You have a  fever or shaking chills. °The severity or intensity of pain increases over 18 hours and is not relieved by pain medicine. °You develop a new onset of abdominal pain. °You feel faint or pass out. °You are unable to urinate. °  °This information is not intended to replace advice given to you by your health care provider. Make sure you discuss any questions you have with your health care provider. °  °Document Released: 09/27/2005 Document Revised: 06/18/2015 Document Reviewed: 02/28/2013 °Elsevier Interactive Patient Education ©2016 [employer] ° ° °

## 2017-03-15 NOTE — ED Triage Notes (Signed)
Per pt, states he came to ED last Tuesday for same symptoms-states pain resolved with medication-states pain came back today-left flank-states history of kidney stones although CT showed no stones when he was evaluated last week

## 2017-03-15 NOTE — ED Notes (Signed)
Patient denies pain and is resting comfortably.  

## 2017-03-23 ENCOUNTER — Other Ambulatory Visit: Payer: Self-pay | Admitting: Urology

## 2017-03-24 ENCOUNTER — Encounter (HOSPITAL_BASED_OUTPATIENT_CLINIC_OR_DEPARTMENT_OTHER): Payer: Self-pay | Admitting: *Deleted

## 2017-03-24 NOTE — Progress Notes (Signed)
Pt instructed npo pmn 6/20 x norvasc, synthroid, norco if needed w sip of water.  To [facility] 6/21 @ 1015.  Needs kub on arrival.  Labs in epic.

## 2017-03-30 NOTE — H&P (Signed)
CC: I have ureteral stone.  HPI: Alexander Cervantes is a 56 year-old male patient who was referred by Dr. Nanda Quinton, MD who is here for ureteral stone.  The problem is on the left side. He first stated noticing pain on 03/09/2017. This is not his first kidney stone. He is currently having flank pain. He denies having back pain, groin pain, nausea, vomiting, fever, and chills. Pain is occuring on the left side. He has not caught a stone in his urine strainer since his symptoms began.   He has never had surgical treatment for calculi in the past.   Alexander Cervantes is a 56 yo male who was seen in the ER on 5/30 for left flank pain with nausea. He had a CT that showed a 4 mm intrarenal stone without obstruction but bilateral peripelvic cysts. he returned to the ER on 6/5 and a KUB showed a possible stone at the left UVJ. he had a prior left UVJ stone in 2011. He has continued to have some pain in the left flank. he last took pain meds yesterday. He has had no hematuria. He has some increased frequency and urgency.      ALLERGIES: None   MEDICATIONS: Levothyroxine Sodium  Amlodipine Besylate 5 mg tablet 1 tablet PO Daily  Hydrocodone-Acetaminophen 5 mg-325 mg tablet 1 tablet PO Daily  Ibuprofen 800 mg tablet 1 tablet PO Daily  Tamsulosin Hcl 0.4 mg capsule, ext release 24 hr 1 capsule PO Daily     GU PSH: None   NON-GU PSH: None   GU PMH: None   NON-GU PMH: GERD Hypertension    FAMILY HISTORY: Cancer - Mother Hardening of the arteries of the heart - Father   SOCIAL HISTORY: Marital Status: Divorced Current Smoking Status: Patient does not smoke anymore.   Tobacco Use Assessment Completed: Used Tobacco in last 30 days? Social Drinker.  Drinks 3 caffeinated drinks per day. 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     Notes: 2 sons   REVIEW OF SYSTEMS:    GU Review Male:   Patient denies frequent urination, hard to postpone urination, burning/ pain with urination, get up at night  to urinate, leakage of urine, stream starts and stops, trouble starting your stream, have to strain to urinate , erection problems, and penile pain.  Gastrointestinal (Upper):   Patient reports nausea. Patient denies vomiting and indigestion/ heartburn.  Gastrointestinal (Lower):   Patient reports diarrhea and constipation.   Constitutional:   Patient reports weight loss and fatigue. Patient denies fever and night sweats.  Skin:   Patient denies skin rash/ lesion and itching.  Eyes:   Patient denies blurred vision and double vision.  Ears/ Nose/ Throat:   Patient reports sinus problems. Patient denies sore throat.  Hematologic/Lymphatic:   Patient denies swollen glands and easy bruising.  Cardiovascular:   Patient denies leg swelling and chest pains.  Respiratory:   Patient reports cough. Patient denies shortness of breath.  Endocrine:   Patient reports excessive thirst.   Musculoskeletal:   Patient reports back pain and joint pain.   Neurological:   Patient reports headaches and dizziness.   Psychologic:   Patient denies depression and anxiety.   VITAL SIGNS:      03/23/2017 11:02 AM  Weight 138 lb / 62.6 kg  Height 66 in / 167.64 cm  BP 159/89 mmHg  Pulse 57 /min  Temperature 97.6 F / 36 C  BMI 22.3 kg/m   MULTI-SYSTEM PHYSICAL EXAMINATION:  Constitutional: Well-nourished. No physical deformities. Normally developed. Good grooming.  Neck: Neck symmetrical, not swollen. Normal tracheal position.  Respiratory: No labored breathing, no use of accessory muscles. CTA  Cardiovascular: Normal temperature,RRR without murmur, .   Lymphatic: No enlargement, no tenderness of axillae, supraclavicular, neck lymph nodes.  Skin: No paleness, no jaundice, no cyanosis. No lesion, no ulcer, no rash.  Neurologic / Psychiatric: Oriented to time, oriented to place, oriented to person. No depression, no anxiety, no agitation.  Gastrointestinal: Abdominal tenderness RCVAT and RUQ. No mass, no rigidity,  non obese abdomen.   Musculoskeletal: Normal gait and station of head and neck.     PAST DATA REVIEWED:  Source Of History:  Patient  Records Review:   Previous Hospital Records  Urine Test Review:   Urinalysis  X-Ray Review: KUB: Reviewed Films. Reviewed Report. Discussed With Patient. 80m distal stone on KUB on 03/15/17.  C.T. Stone Protocol: Reviewed Films. Reviewed Report. Discussed With Patient. 429mright renal stones with bilateral peripelvic cysts.    Notes:                     ER records reviewed.    PROCEDURES:         KUB - [id number] single view of the abdomen is obtained. 16m68mUVJ stone but no other stones. There are no bone, gas or soft tissue abnormalities.                Urinalysis Dipstick Dipstick Cont'd  Color: Yellow Bilirubin: Neg  Appearance: Clear Ketones: Neg  Specific Gravity: 1.015 Blood: Neg  pH: 7.0 Protein: Neg  Glucose: Neg Urobilinogen: 0.2    Nitrites: Neg    Leukocyte Esterase: Neg    ASSESSMENT:      ICD-10 Details  1 GU:   Ureteral calculus - N20.1 He has a 16mm47mVJ stone with persistent symptoms. I have reviewed the options for therapy including MET, URS and ESL and at this time I will give him another week to try to pass the stone but get him on the schedule next week for ureteroscopy should he not have passed the stones. I reviewed the risks of bleeding, infection, ureteral injury, need for stent or secondary procedures, thrombotic events and anesthetic complications. I have refilled the Vicodin.    PLAN:            Medications New Meds: Hydrocodone-Acetaminophen 5 mg-325 mg tablet 1 tablet PO Q 6 H PRN   #15  0 Refill(s)            Orders X-Rays: KUB          Schedule Return Visit/Planned Activity: 1 Week - Schedule Surgery

## 2017-03-31 ENCOUNTER — Encounter (HOSPITAL_BASED_OUTPATIENT_CLINIC_OR_DEPARTMENT_OTHER)
Admission: RE | Disposition: A | Discharge status: Home / Self Care | Payer: Self-pay | Source: Ambulatory Visit | Attending: Urology

## 2017-03-31 ENCOUNTER — Ambulatory Visit (HOSPITAL_BASED_OUTPATIENT_CLINIC_OR_DEPARTMENT_OTHER): Payer: 59 | Admitting: Anesthesiology

## 2017-03-31 ENCOUNTER — Encounter (HOSPITAL_BASED_OUTPATIENT_CLINIC_OR_DEPARTMENT_OTHER): Payer: Self-pay | Admitting: *Deleted

## 2017-03-31 ENCOUNTER — Ambulatory Visit (HOSPITAL_COMMUNITY): Payer: 59

## 2017-03-31 ENCOUNTER — Ambulatory Visit (HOSPITAL_BASED_OUTPATIENT_CLINIC_OR_DEPARTMENT_OTHER)
Admission: RE | Admit: 2017-03-31 | Discharge: 2017-03-31 | Disposition: A | Discharge status: Home / Self Care | Payer: 59 | Source: Ambulatory Visit | Attending: Urology | Admitting: Urology

## 2017-03-31 DIAGNOSIS — N201 Calculus of ureter: Secondary | ICD-10-CM

## 2017-03-31 DIAGNOSIS — I10 Essential (primary) hypertension: Secondary | ICD-10-CM | POA: Diagnosis not present

## 2017-03-31 DIAGNOSIS — Z87442 Personal history of urinary calculi: Secondary | ICD-10-CM | POA: Diagnosis not present

## 2017-03-31 DIAGNOSIS — Z87891 Personal history of nicotine dependence: Secondary | ICD-10-CM | POA: Insufficient documentation

## 2017-03-31 DIAGNOSIS — Z79899 Other long term (current) drug therapy: Secondary | ICD-10-CM | POA: Insufficient documentation

## 2017-03-31 HISTORY — PX: CYSTOSCOPY/URETEROSCOPY/HOLMIUM LASER/STENT PLACEMENT: SHX6546

## 2017-03-31 HISTORY — DX: Personal history of urinary calculi: Z87.442

## 2017-03-31 HISTORY — DX: Calculus of kidney: N20.0

## 2017-03-31 SURGERY — CYSTOSCOPY/URETEROSCOPY/HOLMIUM LASER/STENT PLACEMENT
Anesthesia: General | Site: Ureter | Laterality: Left

## 2017-03-31 MED ORDER — MIDAZOLAM HCL 2 MG/2ML IJ SOLN
INTRAMUSCULAR | Status: AC
  Filled 2017-03-31: qty 2

## 2017-03-31 MED ORDER — ONDANSETRON HCL 4 MG/2ML IJ SOLN
INTRAMUSCULAR | Status: DC | PRN
  Administered 2017-03-31: 4 mg via INTRAVENOUS

## 2017-03-31 MED ORDER — LACTATED RINGERS IV SOLN
INTRAVENOUS | Status: DC
  Filled 2017-03-31: qty 1000

## 2017-03-31 MED ORDER — SUCCINYLCHOLINE CHLORIDE 200 MG/10ML IV SOSY
PREFILLED_SYRINGE | INTRAVENOUS | Status: DC | PRN
  Administered 2017-03-31: 100 mg via INTRAVENOUS

## 2017-03-31 MED ORDER — PHENAZOPYRIDINE HCL 200 MG PO TABS: 200.0000 mg | ORAL_TABLET | Freq: Three times a day (TID) | ORAL | 0 refills | Status: DC | PRN

## 2017-03-31 MED ORDER — LIDOCAINE 2% (20 MG/ML) 5 ML SYRINGE
INTRAMUSCULAR | Status: AC
  Filled 2017-03-31: qty 5

## 2017-03-31 MED ORDER — DEXAMETHASONE SODIUM PHOSPHATE 4 MG/ML IJ SOLN
INTRAMUSCULAR | Status: DC | PRN
  Administered 2017-03-31: 10 mg via INTRAVENOUS

## 2017-03-31 MED ORDER — CEFAZOLIN SODIUM-DEXTROSE 2-4 GM/100ML-% IV SOLN
2.0000 g | INTRAVENOUS | Status: AC
  Administered 2017-03-31: 2 g via INTRAVENOUS
  Filled 2017-03-31: qty 100

## 2017-03-31 MED ORDER — PROPOFOL 10 MG/ML IV BOLUS
INTRAVENOUS | Status: AC
  Filled 2017-03-31: qty 20

## 2017-03-31 MED ORDER — CEFAZOLIN SODIUM-DEXTROSE 2-4 GM/100ML-% IV SOLN
INTRAVENOUS | Status: AC
  Filled 2017-03-31: qty 100

## 2017-03-31 MED ORDER — HYDROCODONE-ACETAMINOPHEN 5-325 MG PO TABS: 1.0000 | ORAL_TABLET | Freq: Four times a day (QID) | ORAL | 0 refills | Status: DC | PRN

## 2017-03-31 MED ORDER — SODIUM CHLORIDE 0.9 % IV SOLN
250.0000 mL | INTRAVENOUS | Status: DC | PRN
  Filled 2017-03-31: qty 250

## 2017-03-31 MED ORDER — EPHEDRINE 5 MG/ML INJ
INTRAVENOUS | Status: AC
  Filled 2017-03-31: qty 10

## 2017-03-31 MED ORDER — MORPHINE SULFATE (PF) 2 MG/ML IV SOLN
2.0000 mg | INTRAVENOUS | Status: DC | PRN
  Filled 2017-03-31: qty 1

## 2017-03-31 MED ORDER — EPHEDRINE SULFATE-NACL 50-0.9 MG/10ML-% IV SOSY
PREFILLED_SYRINGE | INTRAVENOUS | Status: DC | PRN
  Administered 2017-03-31: 10 mg via INTRAVENOUS

## 2017-03-31 MED ORDER — BELLADONNA ALKALOIDS-OPIUM 16.2-60 MG RE SUPP
RECTAL | Status: AC
  Filled 2017-03-31: qty 1

## 2017-03-31 MED ORDER — SODIUM CHLORIDE 0.9 % IR SOLN
Status: DC | PRN
  Administered 2017-03-31: 3000 mL via INTRAVESICAL

## 2017-03-31 MED ORDER — FENTANYL CITRATE (PF) 100 MCG/2ML IJ SOLN
INTRAMUSCULAR | Status: AC
  Filled 2017-03-31: qty 2

## 2017-03-31 MED ORDER — OXYCODONE HCL 5 MG PO TABS
5.0000 mg | ORAL_TABLET | ORAL | Status: DC | PRN
  Filled 2017-03-31: qty 2

## 2017-03-31 MED ORDER — MEPERIDINE HCL 25 MG/ML IJ SOLN
6.2500 mg | INTRAMUSCULAR | Status: DC | PRN
  Filled 2017-03-31: qty 1

## 2017-03-31 MED ORDER — FENTANYL CITRATE (PF) 100 MCG/2ML IJ SOLN
INTRAMUSCULAR | Status: DC | PRN
  Administered 2017-03-31 (×2): 50 ug via INTRAVENOUS

## 2017-03-31 MED ORDER — ACETAMINOPHEN 650 MG RE SUPP
650.0000 mg | RECTAL | Status: DC | PRN
  Filled 2017-03-31: qty 1

## 2017-03-31 MED ORDER — LIDOCAINE 2% (20 MG/ML) 5 ML SYRINGE
INTRAMUSCULAR | Status: DC | PRN
  Administered 2017-03-31: 100 mg via INTRAVENOUS

## 2017-03-31 MED ORDER — SODIUM CHLORIDE 0.9% FLUSH
3.0000 mL | INTRAVENOUS | Status: DC | PRN
  Filled 2017-03-31: qty 3

## 2017-03-31 MED ORDER — ACETAMINOPHEN 325 MG PO TABS
650.0000 mg | ORAL_TABLET | ORAL | Status: DC | PRN
  Filled 2017-03-31: qty 2

## 2017-03-31 MED ORDER — FENTANYL CITRATE (PF) 100 MCG/2ML IJ SOLN
25.0000 ug | INTRAMUSCULAR | Status: DC | PRN
  Filled 2017-03-31: qty 1

## 2017-03-31 MED ORDER — MIDAZOLAM HCL 5 MG/5ML IJ SOLN
INTRAMUSCULAR | Status: DC | PRN
  Administered 2017-03-31: 2 mg via INTRAVENOUS

## 2017-03-31 MED ORDER — IOHEXOL 300 MG/ML  SOLN
INTRAMUSCULAR | Status: DC | PRN
  Administered 2017-03-31: 1 mL via URETHRAL

## 2017-03-31 MED ORDER — SODIUM CHLORIDE 0.9% FLUSH
3.0000 mL | Freq: Two times a day (BID) | INTRAVENOUS | Status: DC
  Filled 2017-03-31: qty 3

## 2017-03-31 MED ORDER — PROPOFOL 10 MG/ML IV BOLUS
INTRAVENOUS | Status: DC | PRN
  Administered 2017-03-31: 160 mg via INTRAVENOUS

## 2017-03-31 MED ORDER — BELLADONNA ALKALOIDS-OPIUM 16.2-60 MG RE SUPP
RECTAL | Status: DC | PRN
  Administered 2017-03-31: 1 via RECTAL

## 2017-03-31 MED ORDER — KETOROLAC TROMETHAMINE 30 MG/ML IJ SOLN
INTRAMUSCULAR | Status: DC | PRN
  Administered 2017-03-31: 30 mg via INTRAVENOUS

## 2017-03-31 MED ORDER — METOCLOPRAMIDE HCL 5 MG/ML IJ SOLN
10.0000 mg | Freq: Once | INTRAMUSCULAR | Status: DC | PRN
  Filled 2017-03-31: qty 2

## 2017-03-31 MED ORDER — PHENAZOPYRIDINE HCL 200 MG PO TABS
200.0000 mg | ORAL_TABLET | Freq: Once | ORAL | Status: AC
  Administered 2017-03-31: 200 mg via ORAL
  Filled 2017-03-31: qty 1

## 2017-03-31 MED ORDER — LACTATED RINGERS IV SOLN
INTRAVENOUS | Status: DC
  Administered 2017-03-31 (×2): via INTRAVENOUS
  Filled 2017-03-31: qty 1000

## 2017-03-31 MED ORDER — LIDOCAINE HCL 2 % EX GEL
CUTANEOUS | Status: AC
  Filled 2017-03-31: qty 5

## 2017-03-31 MED ORDER — LIDOCAINE HCL 2 % EX GEL
CUTANEOUS | Status: DC | PRN
  Administered 2017-03-31: 1 via URETHRAL

## 2017-03-31 MED ORDER — PHENAZOPYRIDINE HCL 100 MG PO TABS
ORAL_TABLET | ORAL | Status: AC
  Filled 2017-03-31: qty 2

## 2017-03-31 SURGICAL SUPPLY — 38 items
BAG DRAIN URO-CYSTO SKYTR STRL (DRAIN) ×3 IMPLANT
BASKET LASER NITINOL 1.9FR (BASKET) IMPLANT
BASKET STNLS GEMINI 4WIRE 3FR (BASKET) IMPLANT
BASKET STONE 1.7 NGAGE (UROLOGICAL SUPPLIES) ×3 IMPLANT
BASKET ZERO TIP NITINOL 2.4FR (BASKET) IMPLANT
CATH URET 5FR 28IN CONE TIP (BALLOONS)
CATH URET 5FR 28IN OPEN ENDED (CATHETERS) ×3 IMPLANT
CATH URET 5FR 70CM CONE TIP (BALLOONS) IMPLANT
CATH URET DUAL LUMEN 6-10FR 50 (CATHETERS) ×3 IMPLANT
CLOTH BEACON ORANGE TIMEOUT ST (SAFETY) ×3 IMPLANT
ELECT REM PT RETURN 9FT ADLT (ELECTROSURGICAL)
ELECTRODE REM PT RTRN 9FT ADLT (ELECTROSURGICAL) IMPLANT
FIBER LASER FLEXIVA 1000 (UROLOGICAL SUPPLIES) IMPLANT
FIBER LASER FLEXIVA 365 (UROLOGICAL SUPPLIES) ×3 IMPLANT
FIBER LASER FLEXIVA 550 (UROLOGICAL SUPPLIES) IMPLANT
FIBER LASER TRAC TIP (UROLOGICAL SUPPLIES) IMPLANT
GLOVE BIOGEL PI IND STRL 7.5 (GLOVE) ×1 IMPLANT
GLOVE BIOGEL PI INDICATOR 7.5 (GLOVE) ×2
GLOVE SURG SS PI 8.0 STRL IVOR (GLOVE) ×3 IMPLANT
GOWN STRL REUS W/ TWL LRG LVL3 (GOWN DISPOSABLE) ×1 IMPLANT
GOWN STRL REUS W/ TWL XL LVL3 (GOWN DISPOSABLE) ×1 IMPLANT
GOWN STRL REUS W/TWL LRG LVL3 (GOWN DISPOSABLE) ×2
GOWN STRL REUS W/TWL XL LVL3 (GOWN DISPOSABLE) ×2
GUIDEWIRE 0.038 PTFE COATED (WIRE) IMPLANT
GUIDEWIRE ANG ZIPWIRE 038X150 (WIRE) IMPLANT
GUIDEWIRE STR DUAL SENSOR (WIRE) ×3 IMPLANT
IV NS IRRIG 3000ML ARTHROMATIC (IV SOLUTION) ×6 IMPLANT
KIT BALLIN UROMAX 15FX10 (LABEL) IMPLANT
KIT BALLN UROMAX 15FX4 (MISCELLANEOUS) IMPLANT
KIT BALLN UROMAX 26 75X4 (MISCELLANEOUS)
KIT RM TURNOVER CYSTO AR (KITS) ×3 IMPLANT
MANIFOLD NEPTUNE II (INSTRUMENTS) IMPLANT
PACK CYSTO (CUSTOM PROCEDURE TRAY) ×3 IMPLANT
SET HIGH PRES BAL DIL (LABEL)
SHEATH ACCESS URETERAL 38CM (SHEATH) ×3 IMPLANT
STENT URET 6FRX24 CONTOUR (STENTS) ×3 IMPLANT
TUBE CONNECTING 12'X1/4 (SUCTIONS)
TUBE CONNECTING 12X1/4 (SUCTIONS) IMPLANT

## 2017-03-31 NOTE — Discharge Instructions (Addendum)
Post Anesthesia Home Care Instructions  Activity: Get plenty of rest for the remainder of the day. A responsible individual must stay with you for 24 hours following the procedure.  For the next 24 hours, DO NOT: -Drive a car -Paediatric nurse -Drink alcoholic beverages -Take any medication unless instructed by your physician -Make any legal decisions or sign important papers.  Meals: Start with liquid foods such as gelatin or soup. Progress to regular foods as tolerated. Avoid greasy, spicy, heavy foods. If nausea and/or vomiting occur, drink only clear liquids until the nausea and/or vomiting subsides. Call your physician if vomiting continues.  Special Instructions/Symptoms: Your throat may feel dry or sore from the anesthesia or the breathing tube placed in your throat during surgery. If this causes discomfort, gargle with warm salt water. The discomfort should disappear within 24 hours.  If you had a scopolamine patch placed behind your ear for the management of post- operative nausea and/or vomiting:  1. The medication in the patch is effective for 72 hours, after which it should be removed.  Wrap patch in a tissue and discard in the trash. Wash hands thoroughly with soap and water. 2. You may remove the patch earlier than 72 hours if you experience unpleasant side effects which may include dry mouth, dizziness or visual disturbances. 3. Avoid touching the patch. Wash your hands with soap and water after contact with the patch.   Ureteral Stent Implantation, Care After Refer to this sheet in the next few weeks. These instructions provide you with information about caring for yourself after your procedure. Your health care provider may also give you more specific instructions. Your treatment has been planned according to current medical practices, but problems sometimes occur. Call your health care provider if you have any problems or questions after your procedure. What can I expect  after the procedure? After the procedure, it is common to have:  Nausea.  Mild pain when you urinate. You may feel this pain in your lower back or lower abdomen. Pain should stop within a few minutes after you urinate. This may last for up to 1 week.  A small amount of blood in your urine for several days.  Follow these instructions at home:  Medicines  Take over-the-counter and prescription medicines only as told by your health care provider.  If you were prescribed an antibiotic medicine, take it as told by your health care provider. Do not stop taking the antibiotic even if you start to feel better.  Do not drive for 24 hours if you received a sedative.  Do not drive or operate heavy machinery while taking prescription pain medicines. Activity  Return to your normal activities as told by your health care provider. Ask your health care provider what activities are safe for you.  Do not lift anything that is heavier than 10 lb (4.5 kg). Follow this limit for 1 week after your procedure, or for as long as told by your health care provider. General instructions  Watch for any blood in your urine. Call your health care provider if the amount of blood in your urine increases.  If you have a catheter: ? Follow instructions from your health care provider about taking care of your catheter and collection bag. ? Do not take baths, swim, or use a hot tub until your health care provider approves.  Drink enough fluid to keep your urine clear or pale yellow.  Keep all follow-up visits as told by your health care provider.  This is important. Contact a health care provider if:  You have pain that gets worse or does not get better with medicine, especially pain when you urinate.  You have difficulty urinating.  You feel nauseous or you vomit repeatedly during a period of more than 2 days after the procedure. Get help right away if:  Your urine is dark red or has blood clots in  it.  You are leaking urine (have incontinence).  The end of the stent comes out of your urethra.  You cannot urinate.  You have sudden, sharp, or severe pain in your abdomen or lower back.  You have a fever.  You may remove the stent by pulling the attached string on Monday morning.  If the stent comes part way out or you start leaking urine you may remove the stent the rest of the way.   This information is not intended to replace advice given to you by your health care provider. Make sure you discuss any questions you have with your health care provider. Document Released: 05/30/2013 Document Revised: 03/04/2016 Document Reviewed: 04/11/2015 Elsevier Interactive Patient Education  [employer].

## 2017-03-31 NOTE — Anesthesia Preprocedure Evaluation (Addendum)
Anesthesia Evaluation    Airway Mallampati: I       Dental  (+) Teeth Intact, Dental Advisory Given,    Pulmonary           Cardiovascular Exercise Tolerance: Good hypertension, Pt. on medications  Rhythm:Regular Rate:Normal     Neuro/Psych    GI/Hepatic hiatal hernia, GERD  Poorly Controlled,  Endo/Other    Renal/GU Renal disease     Musculoskeletal  (+) Arthritis ,   Abdominal   Peds  Hematology   Anesthesia Other Findings   Reproductive/Obstetrics                            Anesthesia Physical Anesthesia Plan Anesthesia Quick Evaluation

## 2017-03-31 NOTE — Brief Op Note (Signed)
03/31/2017  12:33 PM  PATIENT:  Alexander Cervantes  56 y.o. male  PRE-OPERATIVE DIAGNOSIS:  LEFT URETEROVESICAL JUNCTION STONE  POST-OPERATIVE DIAGNOSIS:  LEFT URETEROVESICAL JUNCTION STONE  PROCEDURE:  Procedure(s): CYSTOSCOPY/URETEROSCOPY/HOLMIUM LASER/STENT PLACEMENT (Left)  SURGEON:  Surgeon(s) and Role:    Irine Seal, MD - Primary  PHYSICIAN ASSISTANT:   ASSISTANTS: none   ANESTHESIA:   general  EBL:  Total I/O In: 600 [I.V.:600] Out: -   BLOOD ADMINISTERED:none  DRAINS: 6 x 24 left JJ stent   LOCAL MEDICATIONS USED:  LIDOCAINE  and Amount: 10 ml 2% jelly  SPECIMEN:  Source of Specimen:  ureteral stone fragments  DISPOSITION OF SPECIMEN:  to patient to bring to office  COUNTS:  YES  TOURNIQUET:  * No tourniquets in log *  DICTATION: .Other Dictation: Dictation Number [id number]  PLAN OF CARE: Discharge to home after PACU  PATIENT DISPOSITION:  PACU - hemodynamically stable.   Delay start of Pharmacological VTE agent (>24hrs) due to surgical blood loss or risk of bleeding: not applicable

## 2017-03-31 NOTE — Anesthesia Procedure Notes (Addendum)
Procedure Name: Intubation Date/Time: 03/31/2017 11:52 AM Performed by: Wanita Chamberlain Pre-anesthesia Checklist: Patient identified, Timeout performed, Emergency Drugs available, Suction available and Patient being monitored Patient Re-evaluated:Patient Re-evaluated prior to inductionOxygen Delivery Method: Circle system utilized Preoxygenation: Pre-oxygenation with 100% oxygen Intubation Type: Rapid sequence Laryngoscope Size: Mac and 3 Grade View: Grade I Tube type: Oral Tube size: 7.0 mm Number of attempts: 1 Airway Equipment and Method: Stylet Placement Confirmation: ETT inserted through vocal cords under direct vision,  positive ETCO2 and breath sounds checked- equal and bilateral Secured at: 20 cm Tube secured with: Tape Dental Injury: Teeth and Oropharynx as per pre-operative assessment

## 2017-03-31 NOTE — Interval H&P Note (Signed)
History and Physical Interval Note:  KUB shows no change in left UVJ stone.  03/31/2017 11:32 AM  Alexander Cervantes  has presented today for surgery, with the diagnosis of LEFT URETEROVESICAL JUNCTION STONE  The various methods of treatment have been discussed with the patient and family. After consideration of risks, benefits and other options for treatment, the patient has consented to  Procedure(s): CYSTOSCOPY/URETEROSCOPY/HOLMIUM LASER/STENT PLACEMENT (Left) as a surgical intervention .  The patient's history has been reviewed, patient examined, no change in status, stable for surgery.  I have reviewed the patient's chart and labs.  Questions were answered to the patient's satisfaction.     [name] J

## 2017-03-31 NOTE — Transfer of Care (Signed)
Immediate Anesthesia Transfer of Care Note  Patient: Alexander Cervantes  Procedure(s) Performed: Procedure(s) (LRB): CYSTOSCOPY/URETEROSCOPY/HOLMIUM LASER/STENT PLACEMENT (Left)  Patient Location: PACU  Anesthesia Type: General  Level of Consciousness: awake, sedated, patient cooperative and responds to stimulation  Airway & Oxygen Therapy: Patient Spontanous Breathing and Patient connected to [place] O2  Post-op Assessment: Report given to PACU RN, Post -op Vital signs reviewed and stable and Patient moving all extremities  Post vital signs: Reviewed and stable  Complications: No apparent anesthesia complications

## 2017-03-31 NOTE — Anesthesia Postprocedure Evaluation (Signed)
Anesthesia Post Note  Patient: Alexander Cervantes  Procedure(s) Performed: Procedure(s) (LRB): CYSTOSCOPY/URETEROSCOPY/HOLMIUM LASER/STENT PLACEMENT (Left)     Patient location during evaluation: PACU Anesthesia Type: General Level of consciousness: sedated Pain management: pain level controlled Vital Signs Assessment: post-procedure vital signs reviewed and stable Respiratory status: spontaneous breathing and respiratory function stable Cardiovascular status: stable Anesthetic complications: no    Last Vitals:  Vitals:   03/31/17 1330 03/31/17 1345  BP: (!) 153/83 (!) 162/86  Pulse: (!) 49 (!) 58  Resp: 13 16  Temp:      Last Pain:  Vitals:   03/31/17 1345  TempSrc:   PainSc: 0-No pain                 [name]

## 2017-03-31 NOTE — Anesthesia Preprocedure Evaluation (Signed)
Anesthesia Evaluation  Patient identified by MRN, date of birth, ID band Patient awake    Reviewed: Allergy & Precautions, NPO status , Patient's Chart, lab work & pertinent test results  Airway Mallampati: II  TM Distance: >3 FB Neck ROM: Full    Dental no notable dental hx.    Pulmonary neg pulmonary ROS,    Pulmonary exam normal breath sounds clear to auscultation       Cardiovascular hypertension, Pt. on medications Normal cardiovascular exam Rhythm:Regular Rate:Normal     Neuro/Psych negative neurological ROS  negative psych ROS   GI/Hepatic negative GI ROS, Neg liver ROS, hiatal hernia, GERD  Poorly Controlled,  Endo/Other  negative endocrine ROS  Renal/GU Renal disease  negative genitourinary   Musculoskeletal negative musculoskeletal ROS (+)   Abdominal   Peds negative pediatric ROS (+)  Hematology negative hematology ROS (+)   Anesthesia Other Findings   Reproductive/Obstetrics negative OB ROS                             Anesthesia Physical Anesthesia Plan  ASA: II  Anesthesia Plan: General   Post-op Pain Management:    Induction: Intravenous  PONV Risk Score and Plan: 2 and Ondansetron and Dexamethasone  Airway Management Planned: Oral ETT  Additional Equipment:   Intra-op Plan:   Post-operative Plan: Extubation in OR  Informed Consent: I have reviewed the patients History and Physical, chart, labs and discussed the procedure including the risks, benefits and alternatives for the proposed anesthesia with the patient or authorized representative who has indicated his/her understanding and acceptance.   Dental advisory given  Plan Discussed with: CRNA  Anesthesia Plan Comments:         Anesthesia Quick Evaluation

## 2017-04-01 ENCOUNTER — Encounter (HOSPITAL_BASED_OUTPATIENT_CLINIC_OR_DEPARTMENT_OTHER): Payer: Self-pay | Admitting: Urology

## 2017-04-01 NOTE — Op Note (Signed)
Alexander Cervantes, Alexander Cervantes             ACCOUNT NO.:  [id number]  MEDICAL RECORD NO.:  [id number]  LOCATION:                                 FACILITY:  PHYSICIAN:  Marshall Cork. Jeffie Pollock, M.D.         DATE OF BIRTH:  DATE OF PROCEDURE:  03/31/2017 DATE OF DISCHARGE:                              OPERATIVE REPORT   PROCEDURE: 1. Cystoscopy with left retrograde pyelogram and interpretation. 2. Left ureteroscopy with holmium laser lithotripsy, stone extraction,     and stent placement.  PREOPERATIVE DIAGNOSIS:  Left distal ureteral stone.  POSTOPERATIVE DIAGNOSIS:  Two left distal ureteral stones.  SURGEON:  Marshall Cork. Jeffie Pollock, M.D.  ANESTHESIA:  General.  SPECIMEN:  Stone fragments.  DRAINS:  A 6-French x 24 cm left double-J stent with tether.  ESTIMATED BLOOD LOSS:  None.  COMPLICATIONS:  None.  INDICATIONS:  Alexander Cervantes is a 56 year old white male, who presented with left flank pain.  He was found on CT to have a 4 mm stone in the area of the kidney.  On subsequent KUB, the stone had moved to the left UVJ where it remained today on KUB.  He has continued to have some left flank pain.  FINDINGS AND PROCEDURE:  He was taken to the operating room where general anesthetic was induced.  He was placed in lithotomy position and fitted with PAS hose.  He received Ancef.  His perineum and genitalia were prepped with Betadine solution and was draped in usual sterile fashion.  Cystoscopy was performed using a 23-French scope and 30-degree lens. Examination revealed a normal urethra.  The external sphincter was intact.  The prostatic urethra was short without obstruction. Examination of bladder revealed smooth wall without tumor, stones, or inflammation.  The ureteral orifices were unremarkable.  The left ureteral orifice was cannulated with 5-French open-end catheter.  The orifice was rather tight.  Contrast was instilled.  This revealed a very narrow distal 2-3 cm of ureter with filling  defect consistent with a stone and mild proximal dilation.  A guidewire was then passed through the open-end catheter and advanced to the kidney and the cystoscope was removed.  An initial attempt to pass the 4.5 French semirigid ureteroscope alongside the wire was unsuccessful beyond approximately a centimeter and half due to narrowing.  I then removed the ureteroscope and inserted the inner core of a 12- [place] access sheath.  I was able to dilate the meatus, but not proximal to that.  A 10-French dual-lumen catheter was then used and I was able to dilate slightly further.  At this point, another attempt was made to pass the ureteroscope alongside the wire.  I was able to get about 2 cm, but not beyond.  I then elected to remove the guidewire because it occupied a fairly significant portion of the ureteral lumen and with this maneuver, I was able to get the 4.5 French ureteroscope up to the level of the stone.  I found that he had 2 stones, a 4 mm and approximately 3 mm stone in the distal ureter.  I advanced the scope more proximally to dilate the distal ureter.  The smaller of the 2  stones was then grasped with an NGage basket and removed intact.  The larger stone was then engaged with a 365 micron laser fiber initially on 0.5 watts, but eventually 1 watt and 10 hertz.  The stone broke into manageable fragments, which were then removed with the NGage basket.  I then inserted the scope into the proximal ureter and inspected for residual stones.  Nothing more than dust was noted.  Guidewire was then reinserted through the scope to the kidney.  The scope was removed.  The cystoscope was reinserted over the wire and a 6-French 24 cm Contour double-J stent with string was passed without difficulty into the kidney under fluoroscopic guidance.  The wire was removed leaving good coil in the kidney and a good coil in the bladder.  The stone fragments, which had been relocated in  the bladder were then aspirated from the bladder.  The bladder was drained.  The cystoscope was removed.  The stent string was secured to the penis with pink tape. Lidocaine jelly was instilled in the urethra of 10 mL, 2% jelly were used.  B and O suppository was placed.  He was taken down from lithotomy position.  His anesthetic was reversed.  He was moved to the recovery room in stable condition.  The stone fragments were given to the patient to bring to the office at followup.  There were no complications.     Marshall Cork. Jeffie Pollock, M.D.     JJW/MEDQ  D:  03/31/2017  T:  03/31/2017  Job:  [id number]

## 2017-05-30 ENCOUNTER — Telehealth: Payer: Self-pay | Admitting: Gastroenterology

## 2017-05-30 MED ORDER — OMEPRAZOLE 40 MG PO CPDR: 40.0000 mg | DELAYED_RELEASE_CAPSULE | Freq: Every day | ORAL | 6 refills | Status: DC

## 2017-05-30 NOTE — Telephone Encounter (Signed)
Per procedure pathology note:  Biopsies from stomach are positive for H. Pylori. Please call in PMPT 14 day therapy: First try 14 day course of Pylera. At the same time they need to be on twice daily PPI. He needs to call back in 6-8 weeks to report on his response.  Pt continues to have upper abd pain after eating, no worse but no better.  No certain food makes it hurt.  He finished the abx for H pylori.  NO other GI complaints  Please advise

## 2017-05-30 NOTE — Telephone Encounter (Signed)
Pt aware that prescription has been sent and follow up scheuled

## 2017-05-30 NOTE — Telephone Encounter (Signed)
Needs to completely avoid NSAIDs if possible. It does not look like he is taking PPI by current med list, can you call in omeprazole 40mg  pills, one pill once daily before BF Idisp 30 with 6 refills) and rov with me next available.

## 2017-08-09 ENCOUNTER — Ambulatory Visit (INDEPENDENT_AMBULATORY_CARE_PROVIDER_SITE_OTHER): Payer: 59 | Admitting: Gastroenterology

## 2017-08-09 ENCOUNTER — Encounter: Payer: Self-pay | Admitting: Gastroenterology

## 2017-08-09 VITALS — BP 122/86 | HR 80 | Ht 65.75 in | Wt 139.0 lb

## 2017-08-09 DIAGNOSIS — R1013 Epigastric pain: Secondary | ICD-10-CM | POA: Diagnosis not present

## 2017-08-09 DIAGNOSIS — R6881 Early satiety: Secondary | ICD-10-CM

## 2017-08-09 NOTE — Patient Instructions (Addendum)
You will be set up for a CT scan of abdomen and pelvis with IV and oral contrast for epigastric pain.   You have been scheduled for a CT scan of the abdomen and pelvis at [facility] ([address]---this is in the same building as [facility]).   You are scheduled on 08/16/17  at 330 pm. You should arrive 15 minutes prior to your appointment time for registration. Please follow the written instructions below on the day of your exam:  WARNING: IF YOU ARE ALLERGIC TO IODINE/X-RAY DYE, PLEASE NOTIFY RADIOLOGY IMMEDIATELY AT [phone]! YOU WILL BE GIVEN A 13 HOUR PREMEDICATION PREP.  1) Do not eat or drink anything after 1130 am (4 hours prior to your test) 2) You have been given 2 bottles of oral contrast to drink. The solution may taste better if refrigerated, but do NOT add ice or any other liquid to this solution. Shake well before drinking.    Drink 1 bottle of contrast @ 130 pm (2 hours prior to your exam)  Drink 1 bottle of contrast @ 230 pm (1 hour prior to your exam)  You may take any medications as prescribed with a small amount of water except for the following: Metformin, Glucophage, Glucovance, Avandamet, Riomet, Fortamet, Actoplus Met, Janumet, Glumetza or Metaglip. The above medications must be held the day of the exam AND 48 hours after the exam.  The purpose of you drinking the oral contrast is to aid in the visualization of your intestinal tract. The contrast solution may cause some diarrhea. Before your exam is started, you will be given a small amount of fluid to drink. Depending on your individual set of symptoms, you may also receive an intravenous injection of x-ray contrast/dye. Plan on being at [facility] for 30 minutes or longer, depending on the type of exam you are having performed.  This test typically takes 30-45 minutes to complete.  If you have any questions regarding your exam or if you need to reschedule, you may call the CT department  at [phone] between the hours of 8:00 am and 5:00 pm, Monday-Friday.  ________________________________________________________________________   H. Pylori stool testing in 2 weeks, stop omeprazole today.  Normal BMI (Body Mass Index- based on height and weight) is between 19 and 25. Your BMI today is Body mass index is 22.61 kg/m. . Please consider follow up  regarding your BMI with your Primary Care Provider.  

## 2017-08-09 NOTE — Progress Notes (Signed)
Review of pertinent gastrointestinal problems: 1. Personal history of precancerous colon polyp and FH of colon cancer. Colonoscopy March 2018 done for family history of colon cancer, his mother had MAY have had colon cancer in her 57s.  2 sub-centimeter polyps were removed from the colon. These were adenomas. He was recommended to have repeat colonoscopy at five-year interval. 2. Post prandial pain, weight loss 2018: labs 2018 tTG, total IgA were normal; cbc, cmet were normal. EGD 01/2017 Dr. Ardis Cervantes moderate gastritis, small HH. Biopsies + for H.pylori. Treated with pylera.   HPI: This is a very pleasant Alexander Cervantes man 56 year old whom I last saw out 6 or 8 months ago at the time of an upper endoscopy.  Chief complaint is persistent epigastric pain, dyspepsia.  The pain is nearly constant but definitely worse after he eats  He may have improved temporarily after pylera treatment.  Still has same pain that is worse after he eats.  Knots up after eating.   Overall stable weight.    Nausea, vomiting is better after the antibiotic treatment.  He is still taking omeprazole 1 pill once daily currently.  Rarely takes NSAIDs  ROS: complete GI ROS as described in HPI, all other review negative.  Constitutional:  weight down 2 pounds in 7 months   Past Medical History:  Diagnosis Date  . Arthritis    hands  . History of kidney stones   . Hypertension   . Kidney calculus   . Thyroid disease     Past Surgical History:  Procedure Laterality Date  . CYSTOSCOPY/URETEROSCOPY/HOLMIUM LASER/STENT PLACEMENT Left 03/31/2017   Procedure: CYSTOSCOPY/URETEROSCOPY/HOLMIUM LASER/STENT PLACEMENT;  Surgeon: Alexander Seal, MD;  Location: [facility];  Service: Urology;  Laterality: Left;  Marland Kitchen MASS EXCISION  08/01/2012   Procedure: EXCISION MASS;  Surgeon: Alexander Rossetti, MD;  Location: [facility];  Service: Orthopedics;  Laterality: Left;  Excision left elbow mass    Current Outpatient  Prescriptions  Medication Sig Dispense Refill  . amLODipine (NORVASC) 5 MG tablet Take 5 mg by mouth daily.    Marland Kitchen levothyroxine (SYNTHROID, LEVOTHROID) 50 MCG tablet Take 50 mcg by mouth daily before breakfast.     . omeprazole (PRILOSEC) 40 MG capsule Take 1 capsule (40 mg total) by mouth daily. 30 capsule 6   No current facility-administered medications for this visit.     Allergies as of 08/09/2017  . (No Known Allergies)    Family History  Problem Relation Age of Onset  . Colon cancer Mother        in her mid 74's  . Heart attack Father   . Esophageal cancer Neg Hx   . Rectal cancer Neg Hx   . Stomach cancer Neg Hx     Social History   Social History  . Marital status: Divorced    Spouse name: N/A  . Number of children: 2  . Years of education: N/A   Occupational History  . diesel mechancial    Social History Main Topics  . Smoking status: Never Smoker  . Smokeless tobacco: Never Used  . Alcohol use No  . Drug use: No  . Sexual activity: Not on file   Other Topics Concern  . Not on file   Social History Narrative  . No narrative on file     Physical Exam: BP 122/86 (BP Location: Left Arm, Patient Position: Sitting, Cuff Size: Normal)   Pulse 80   Ht 5' 5.75" (1.67 m) Comment: height measured without shoes  Wt 139 lb (63 kg)   BMI 22.61 kg/m  Constitutional: generally well-appearing Psychiatric: alert and oriented x3 Abdomen: soft, nontender, nondistended, no obvious ascites, no peritoneal signs, normal bowel sounds No peripheral edema noted in lower extremities  Assessment and plan: 56 y.o. male with H. pylori gastritis  I think he had temporary improvement after Pylera treatment but his symptoms have definitely returned.  I wonder about persistent infection.  He is going to stop his proton pump inhibitor and in 2 weeks he will get stool antigen testing for H. pylori.  I am also arranging for CT scan abdomen and pelvis to check for other potential  causes.  Please see the "Patient Instructions" section for addition details about the plan.  Alexander Loffler, MD [facility] 08/09/2017, 2:44 PM

## 2017-08-16 ENCOUNTER — Ambulatory Visit (INDEPENDENT_AMBULATORY_CARE_PROVIDER_SITE_OTHER)
Admission: RE | Admit: 2017-08-16 | Discharge: 2017-08-16 | Disposition: A | Discharge status: Home / Self Care | Payer: 59 | Source: Ambulatory Visit | Attending: Gastroenterology | Admitting: Gastroenterology

## 2017-08-16 DIAGNOSIS — R1013 Epigastric pain: Secondary | ICD-10-CM | POA: Diagnosis not present

## 2017-08-16 MED ORDER — IOPAMIDOL (ISOVUE-300) INJECTION 61%
100.0000 mL | Freq: Once | INTRAVENOUS | Status: AC | PRN
  Administered 2017-08-16: 100 mL via INTRAVENOUS

## 2017-08-29 ENCOUNTER — Telehealth: Payer: Self-pay | Admitting: Gastroenterology

## 2017-08-29 DIAGNOSIS — A048 Other specified bacterial intestinal infections: Secondary | ICD-10-CM

## 2017-08-29 NOTE — Telephone Encounter (Signed)
The pt was advised to come in for H pylori stool testing.  He will come in at his soonest convenience

## 2017-09-13 ENCOUNTER — Other Ambulatory Visit: Payer: 59

## 2017-09-13 DIAGNOSIS — A048 Other specified bacterial intestinal infections: Secondary | ICD-10-CM

## 2017-09-14 LAB — HELICOBACTER PYLORI  SPECIAL ANTIGEN
MICRO NUMBER: 81361651
SPECIMEN QUALITY: ADEQUATE

## 2017-11-13 ENCOUNTER — Other Ambulatory Visit: Payer: Self-pay | Admitting: Gastroenterology

## 2018-04-26 ENCOUNTER — Other Ambulatory Visit: Payer: Self-pay | Admitting: Gastroenterology

## 2018-11-15 ENCOUNTER — Ambulatory Visit (INDEPENDENT_AMBULATORY_CARE_PROVIDER_SITE_OTHER): Payer: 59 | Admitting: Gastroenterology

## 2018-11-15 ENCOUNTER — Encounter (INDEPENDENT_AMBULATORY_CARE_PROVIDER_SITE_OTHER): Payer: Self-pay

## 2018-11-15 ENCOUNTER — Ambulatory Visit: Payer: 59 | Admitting: Gastroenterology

## 2018-11-15 ENCOUNTER — Encounter: Payer: Self-pay | Admitting: Gastroenterology

## 2018-11-15 VITALS — BP 126/80 | HR 72 | Ht 65.75 in | Wt 145.4 lb

## 2018-11-15 DIAGNOSIS — R1013 Epigastric pain: Secondary | ICD-10-CM | POA: Diagnosis not present

## 2018-11-15 NOTE — Progress Notes (Signed)
Review of pertinent gastrointestinal problems: 1. Personal history of precancerous colon polyp and FH of colon cancer.Colonoscopy March 2018 done for family history of colon cancer, his mother had MAY have had colon cancer in her 43s. 2 sub-centimeter polyps were removed from the colon. These were adenomas. He was recommended to have repeat colonoscopy at five-year interval. 2. Post prandial pain, weight loss 2018: labs 2018 tTG, total IgA were normal; cbc, cmet were normal. EGD 01/2017 Dr. Ardis Hughs moderate gastritis, small HH. Biopsies + for H.pylori. Treated with pylera.  08/2017 CT scan abd/pelvis essentially normal. 09/2017 h. Pylori stool Ag negative.   HPI: This is a very pleasant 58 year old man whom I last saw about a year ago.  He is still bothered by epigastric pains.  The pains are a twisting sensation, they are present intermittently throughout the day certainly worse after eating.  It can be constant for days at a time as well.  No fevers no chills.  He has been gaining weight.  He has no dysphasia.  No significant nausea or vomiting.   Chief complaint is epigastric pain  ROS: complete GI ROS as described in HPI, all other review negative.  Constitutional:  No unintentional weight loss   Past Medical History:  Diagnosis Date  . Arthritis    hands  . History of kidney stones   . Hypertension   . Kidney calculus   . Thyroid disease     Past Surgical History:  Procedure Laterality Date  . CYSTOSCOPY/URETEROSCOPY/HOLMIUM LASER/STENT PLACEMENT Left 03/31/2017   Procedure: CYSTOSCOPY/URETEROSCOPY/HOLMIUM LASER/STENT PLACEMENT;  Surgeon: Irine Seal, MD;  Location: [facility];  Service: Urology;  Laterality: Left;  Marland Kitchen MASS EXCISION  08/01/2012   Procedure: EXCISION MASS;  Surgeon: Mcarthur Rossetti, MD;  Location: [facility];  Service: Orthopedics;  Laterality: Left;  Excision left elbow mass    Current Outpatient Medications  Medication Sig Dispense Refill   . amLODipine (NORVASC) 10 MG tablet Take 10 mg by mouth daily.    Marland Kitchen levothyroxine (SYNTHROID, LEVOTHROID) 50 MCG tablet Take 50 mcg by mouth daily before breakfast.     . pantoprazole (PROTONIX) 40 MG tablet Take 40 mg by mouth daily.     No current facility-administered medications for this visit.     Allergies as of 11/15/2018  . (No Known Allergies)    Family History  Problem Relation Age of Onset  . Colon cancer Mother        in her mid 13's  . Heart attack Father   . Esophageal cancer Neg Hx   . Rectal cancer Neg Hx   . Stomach cancer Neg Hx     Social History   Socioeconomic History  . Marital status: Divorced    Spouse name: Not on file  . Number of children: 2  . Years of education: Not on file  . Highest education level: Not on file  Occupational History  . Occupation: Clinical biochemist  Social Needs  . Financial resource strain: Not on file  . Food insecurity:    Worry: Not on file    Inability: Not on file  . Transportation needs:    Medical: Not on file    Non-medical: Not on file  Tobacco Use  . Smoking status: Never Smoker  . Smokeless tobacco: Never Used  Substance and Sexual Activity  . Alcohol use: No  . Drug use: No  . Sexual activity: Not on file  Lifestyle  . Physical activity:    Days  per week: Not on file    Minutes per session: Not on file  . Stress: Not on file  Relationships  . Social connections:    Talks on phone: Not on file    Gets together: Not on file    Attends religious service: Not on file    Active member of club or organization: Not on file    Attends meetings of clubs or organizations: Not on file    Relationship status: Not on file  . Intimate partner violence:    Fear of current or ex partner: Not on file    Emotionally abused: Not on file    Physically abused: Not on file    Forced sexual activity: Not on file  Other Topics Concern  . Not on file  Social History Narrative  . Not on file     Physical  Exam: Ht 5' 5.75" (1.67 m)   Wt 145 lb 6 oz (65.9 kg)   BMI 23.64 kg/m  Constitutional: generally well-appearing Psychiatric: alert and oriented x3 Abdomen: soft, nontender, nondistended, no obvious ascites, no peritoneal signs, normal bowel sounds No peripheral edema noted in lower extremities  Assessment and plan: 58 y.o. male with epigastric pain  Not classic for biliary colic but I wonder if that is indeed the case here.  He had an EGD and was found to have H. pylori positive gastritis.  Stool antigen was negative about a year ago but I want to retest that as well as checking abdominal ultrasound especially looking at his gallbladder for gallstones.  The stool antigen will be 10 days from now to allow for Protonix washout  Please see the "Patient Instructions" section for addition details about the plan.  Owens Loffler, MD [facility] 11/15/2018, 10:36 AM

## 2018-11-15 NOTE — Patient Instructions (Addendum)
You will be set up for an ultrasound abdominal for epigastric pain.  H pylori stool antigen after 10 days OFF pantoprazole. On 11/25/18  You have been scheduled for an abdominal ultrasound at [facility] (1st floor of hospital) on 11/21/18 at [place]. Please arrive 15 minutes prior to your appointment for registration. Make certain not to have anything to eat or drink 6 hours prior to your appointment. Should you need to reschedule your appointment, please contact radiology at [phone]. This test typically takes about 30 minutes to perform.  Thank you for entrusting me with your care and choosing [facility].  Dr Ardis Hughs

## 2018-11-21 ENCOUNTER — Ambulatory Visit (HOSPITAL_COMMUNITY)
Admission: RE | Admit: 2018-11-21 | Discharge: 2018-11-21 | Disposition: A | Discharge status: Home / Self Care | Payer: 59 | Source: Ambulatory Visit | Attending: Gastroenterology | Admitting: Gastroenterology

## 2018-11-21 DIAGNOSIS — R1013 Epigastric pain: Secondary | ICD-10-CM | POA: Insufficient documentation

## 2018-11-27 ENCOUNTER — Other Ambulatory Visit: Payer: 59

## 2018-11-28 ENCOUNTER — Other Ambulatory Visit: Payer: 59

## 2018-11-28 DIAGNOSIS — R1013 Epigastric pain: Secondary | ICD-10-CM

## 2018-11-29 LAB — HELICOBACTER PYLORI  SPECIAL ANTIGEN
MICRO NUMBER:: 209512
SPECIMEN QUALITY: ADEQUATE

## 2020-03-22 ENCOUNTER — Other Ambulatory Visit: Payer: Self-pay

## 2020-03-22 ENCOUNTER — Emergency Department (HOSPITAL_COMMUNITY)
Admission: EM | Admit: 2020-03-22 | Discharge: 2020-03-22 | Disposition: A | Discharge status: Home / Self Care | Payer: 59 | Attending: Emergency Medicine | Admitting: Emergency Medicine

## 2020-03-22 ENCOUNTER — Encounter (HOSPITAL_COMMUNITY): Payer: Self-pay

## 2020-03-22 ENCOUNTER — Emergency Department (HOSPITAL_COMMUNITY): Payer: 59

## 2020-03-22 DIAGNOSIS — Y29XXXA Contact with blunt object, undetermined intent, initial encounter: Secondary | ICD-10-CM | POA: Insufficient documentation

## 2020-03-22 DIAGNOSIS — I10 Essential (primary) hypertension: Secondary | ICD-10-CM | POA: Diagnosis not present

## 2020-03-22 DIAGNOSIS — Y9389 Activity, other specified: Secondary | ICD-10-CM | POA: Diagnosis not present

## 2020-03-22 DIAGNOSIS — Y990 Civilian activity done for income or pay: Secondary | ICD-10-CM | POA: Insufficient documentation

## 2020-03-22 DIAGNOSIS — Z960 Presence of urogenital implants: Secondary | ICD-10-CM | POA: Diagnosis not present

## 2020-03-22 DIAGNOSIS — S80211A Abrasion, right knee, initial encounter: Secondary | ICD-10-CM | POA: Diagnosis not present

## 2020-03-22 DIAGNOSIS — L03115 Cellulitis of right lower limb: Secondary | ICD-10-CM | POA: Diagnosis not present

## 2020-03-22 DIAGNOSIS — S8991XA Unspecified injury of right lower leg, initial encounter: Secondary | ICD-10-CM | POA: Diagnosis present

## 2020-03-22 DIAGNOSIS — E039 Hypothyroidism, unspecified: Secondary | ICD-10-CM | POA: Diagnosis not present

## 2020-03-22 DIAGNOSIS — Z79899 Other long term (current) drug therapy: Secondary | ICD-10-CM | POA: Diagnosis not present

## 2020-03-22 DIAGNOSIS — Y92513 Shop (commercial) as the place of occurrence of the external cause: Secondary | ICD-10-CM | POA: Diagnosis not present

## 2020-03-22 DIAGNOSIS — S8990XA Unspecified injury of unspecified lower leg, initial encounter: Secondary | ICD-10-CM

## 2020-03-22 MED ORDER — DOXYCYCLINE HYCLATE 100 MG PO CAPS
100.0000 mg | ORAL_CAPSULE | Freq: Two times a day (BID) | ORAL | 0 refills | Status: AC
Start: 2020-03-22 — End: 2020-03-29
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
## 2020-03-22 NOTE — Discharge Instructions (Signed)
You are seen today for knee injury, it seems as if you are developing some type of skin infection around that area.  Take your antibiotics as prescribed for the next 7 days.  Make sure to eat with them.  Come back to the emergency department if you have any worsening or concerning symptoms or reactions to the antibiotic.  I also attached a guide for cellulitis that he should go over.  Went to follow-up with your primary care in the next couple of days.  Want you to use the crutches and elevate your leg.  You can also use Tylenol or ibuprofen instructed on the bottle for pain.

## 2021-12-10 ENCOUNTER — Encounter: Payer: Self-pay | Admitting: Gastroenterology

## 2022-02-16 IMAGING — CR DG KNEE COMPLETE 4+V*R*
5 series · 5 of 5 positions shown · non-contrast
Comparison: None.

CLINICAL DATA: Blunt trauma to the right knee with persistent pain,
initial encounter

EXAM:
RIGHT KNEE - COMPLETE 4+ VIEW

[t knee ap right]
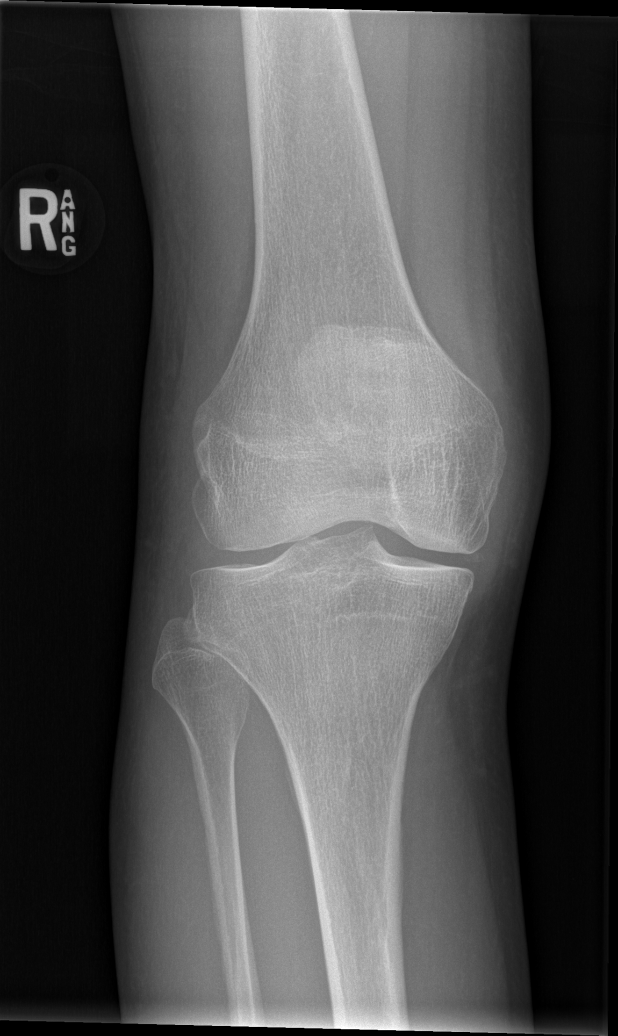

[t knee obl right (1 of 2)]
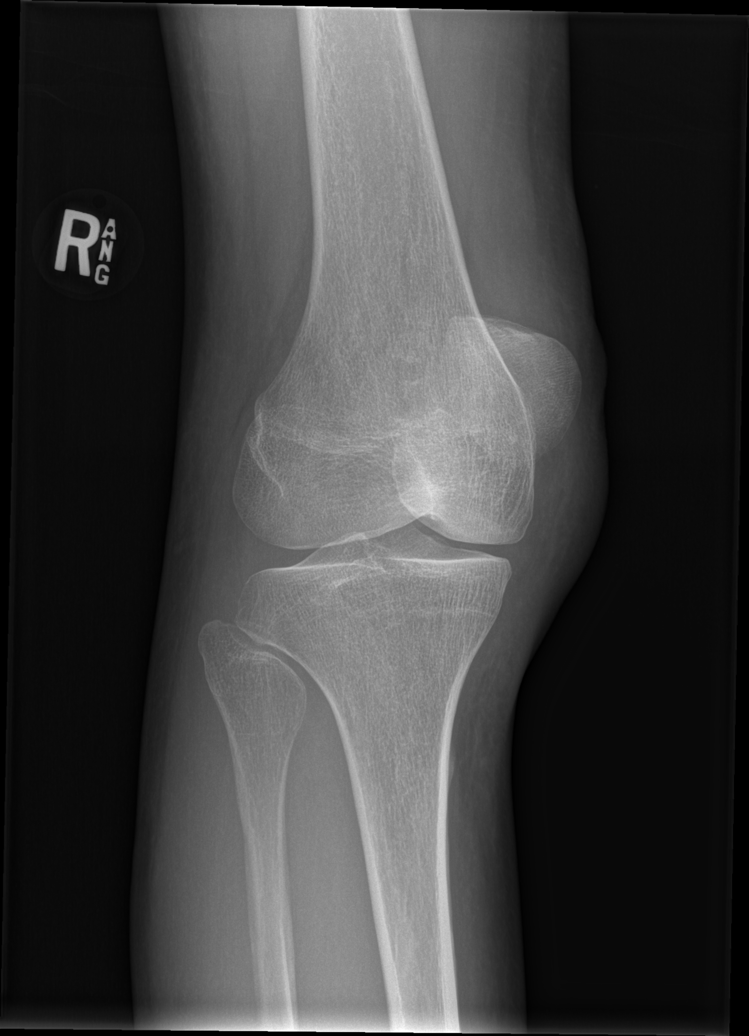

[t knee obl right (2 of 2)]
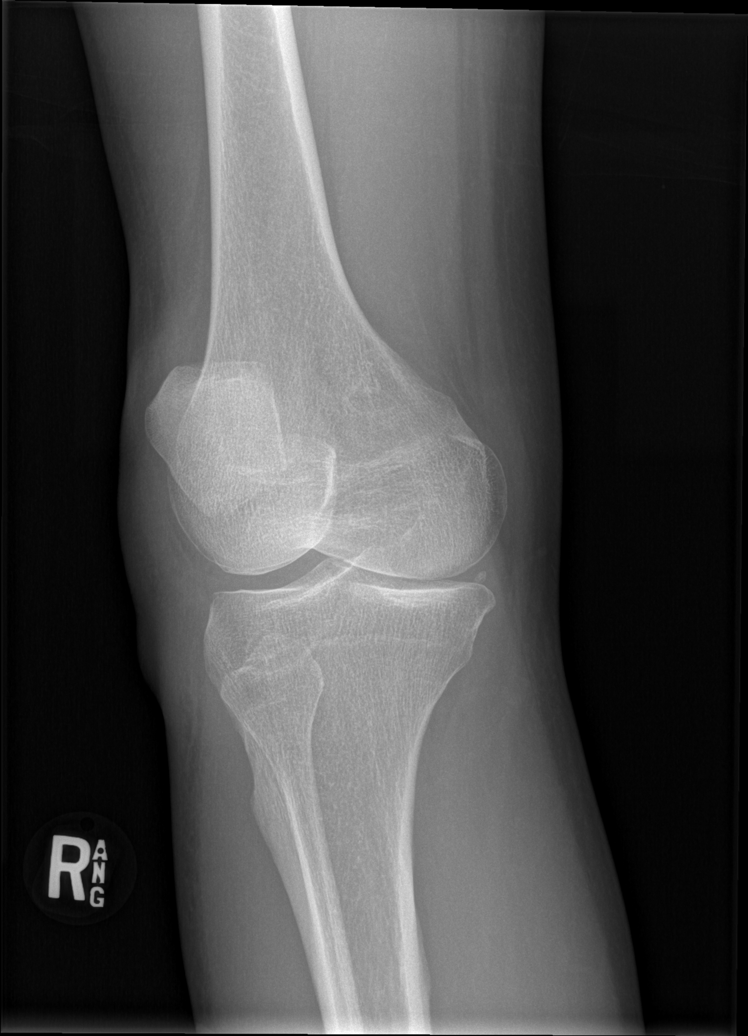

[t knee lat right (1 of 2)]
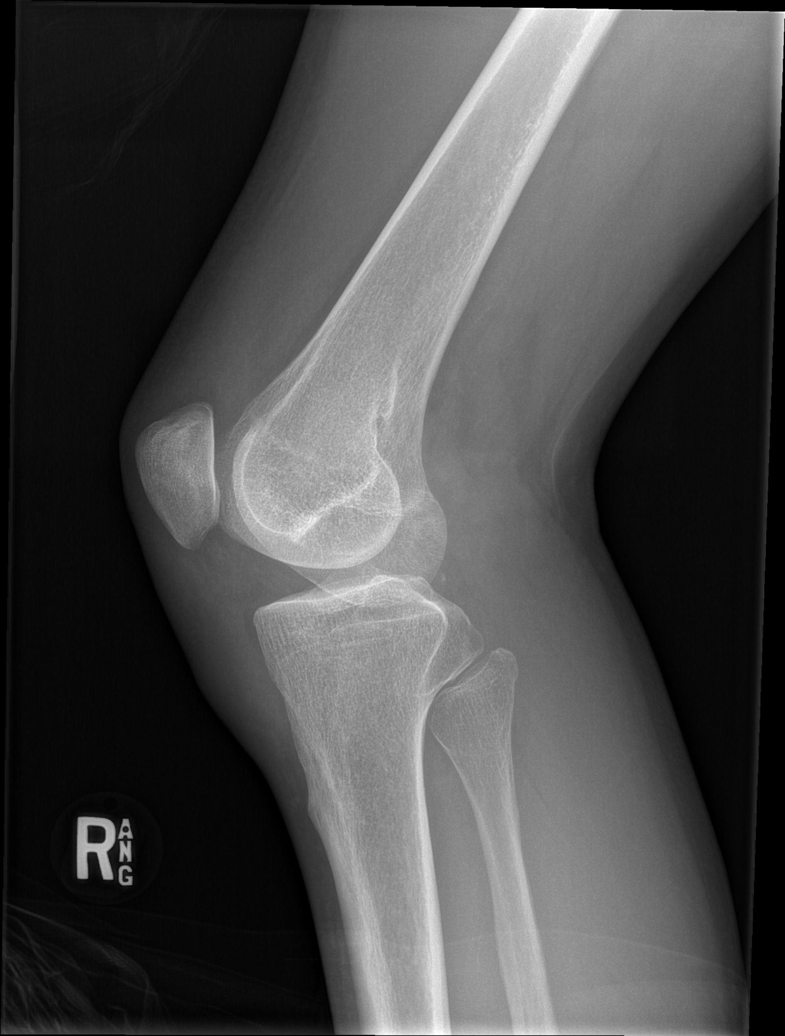

[t knee lat right (2 of 2)]
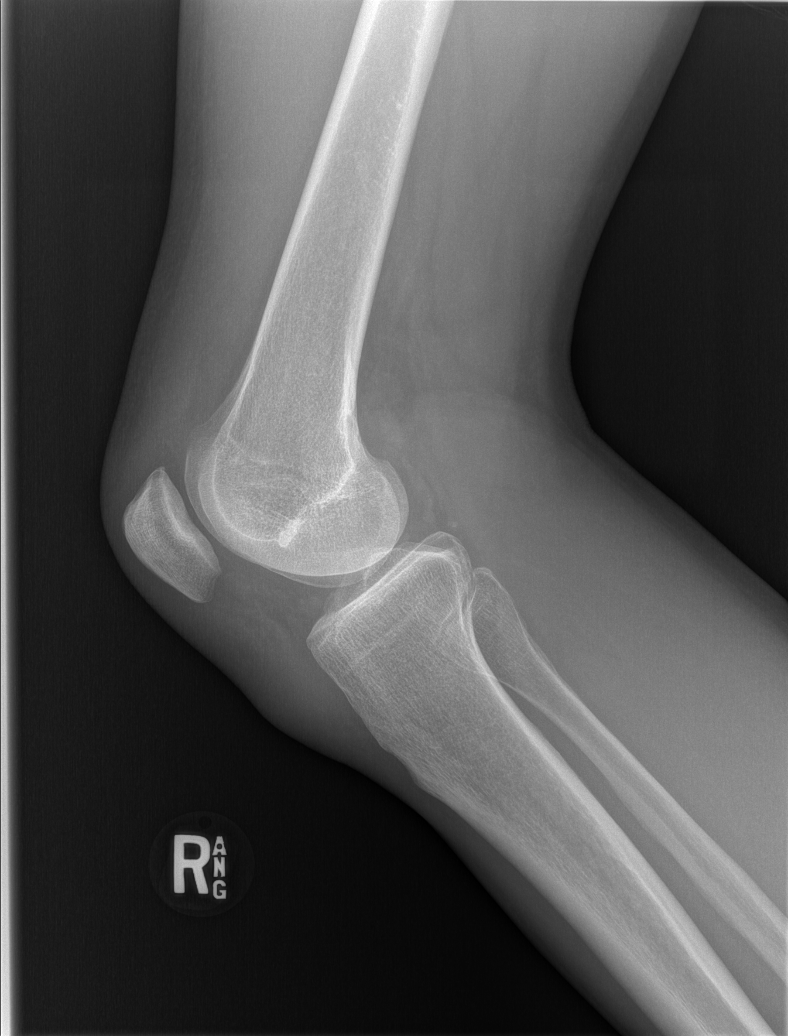

[5 of 5 positions shown; findings below may reference images not displayed]

FINDINGS: No acute fracture or dislocation is noted. Mild soft tissue swelling
is noted in the infrapatellar region anteriorly. No joint effusion
is seen.
IMPRESSION: Soft tissue swelling consistent with the recent injury. No acute
bony abnormality is noted.

## 2022-03-10 ENCOUNTER — Encounter: Payer: Self-pay | Admitting: Internal Medicine

## 2023-11-21 ENCOUNTER — Encounter: Payer: Self-pay | Admitting: Family Medicine
